# Patient Record
Sex: Male | Born: 1935 | Race: White | Hispanic: No | Marital: Single | Smoking: Former smoker
Health system: Southern US, Community
[De-identification: ages and names within clinical notes are randomized; demographics above are authoritative.]

## PROBLEM LIST (undated history)

## (undated) DIAGNOSIS — E785 Hyperlipidemia, unspecified: Secondary | ICD-10-CM

## (undated) DIAGNOSIS — D126 Benign neoplasm of colon, unspecified: Secondary | ICD-10-CM

## (undated) DIAGNOSIS — R0609 Other forms of dyspnea: Secondary | ICD-10-CM

## (undated) DIAGNOSIS — K2270 Barrett's esophagus without dysplasia: Secondary | ICD-10-CM

## (undated) DIAGNOSIS — H409 Unspecified glaucoma: Secondary | ICD-10-CM

## (undated) DIAGNOSIS — K5730 Diverticulosis of large intestine without perforation or abscess without bleeding: Secondary | ICD-10-CM

## (undated) DIAGNOSIS — E559 Vitamin D deficiency, unspecified: Secondary | ICD-10-CM

## (undated) DIAGNOSIS — E119 Type 2 diabetes mellitus without complications: Secondary | ICD-10-CM

## (undated) DIAGNOSIS — H269 Unspecified cataract: Secondary | ICD-10-CM

## (undated) DIAGNOSIS — I6529 Occlusion and stenosis of unspecified carotid artery: Secondary | ICD-10-CM

## (undated) DIAGNOSIS — C4440 Unspecified malignant neoplasm of skin of scalp and neck: Secondary | ICD-10-CM

## (undated) DIAGNOSIS — I10 Essential (primary) hypertension: Secondary | ICD-10-CM

## (undated) DIAGNOSIS — R0602 Shortness of breath: Secondary | ICD-10-CM

## (undated) DIAGNOSIS — N400 Enlarged prostate without lower urinary tract symptoms: Secondary | ICD-10-CM

## (undated) DIAGNOSIS — K2100 Gastro-esophageal reflux disease with esophagitis, without bleeding: Secondary | ICD-10-CM

## (undated) DIAGNOSIS — R0989 Other specified symptoms and signs involving the circulatory and respiratory systems: Secondary | ICD-10-CM

## (undated) DIAGNOSIS — I4510 Unspecified right bundle-branch block: Secondary | ICD-10-CM

## (undated) DIAGNOSIS — E669 Obesity, unspecified: Secondary | ICD-10-CM

## (undated) DIAGNOSIS — Z8673 Personal history of transient ischemic attack (TIA), and cerebral infarction without residual deficits: Secondary | ICD-10-CM

## (undated) DIAGNOSIS — G4733 Obstructive sleep apnea (adult) (pediatric): Secondary | ICD-10-CM

## (undated) HISTORY — PX: CATARACT EXTRACTION: SUR2

## (undated) HISTORY — DX: Barrett's esophagus without dysplasia: K22.70

## (undated) HISTORY — DX: Obstructive sleep apnea (adult) (pediatric): G47.33

## (undated) HISTORY — DX: Other forms of dyspnea: R09.89

## (undated) HISTORY — DX: Benign prostatic hyperplasia without lower urinary tract symptoms: N40.0

## (undated) HISTORY — DX: Benign neoplasm of colon, unspecified: D12.6

## (undated) HISTORY — DX: Shortness of breath: R06.02

## (undated) HISTORY — DX: Unspecified cataract: H26.9

## (undated) HISTORY — DX: Essential (primary) hypertension: I10

## (undated) HISTORY — DX: Unspecified right bundle-branch block: I45.10

## (undated) HISTORY — DX: Unspecified malignant neoplasm of skin of scalp and neck: C44.40

## (undated) HISTORY — DX: Diverticulosis of large intestine without perforation or abscess without bleeding: K57.30

## (undated) HISTORY — DX: Hyperlipidemia, unspecified: E78.5

## (undated) HISTORY — DX: Other forms of dyspnea: R06.09

## (undated) HISTORY — DX: Gastro-esophageal reflux disease with esophagitis, without bleeding: K21.00

## (undated) HISTORY — DX: Unspecified glaucoma: H40.9

## (undated) HISTORY — DX: Obesity, unspecified: E66.9

## (undated) HISTORY — DX: Gastro-esophageal reflux disease with esophagitis: K21.0

## (undated) HISTORY — DX: Vitamin D deficiency, unspecified: E55.9

## (undated) HISTORY — DX: Type 2 diabetes mellitus without complications: E11.9

## (undated) HISTORY — PX: RECTAL SURGERY: SHX760

## (undated) HISTORY — DX: Occlusion and stenosis of unspecified carotid artery: I65.29

## (undated) HISTORY — DX: Personal history of transient ischemic attack (TIA), and cerebral infarction without residual deficits: Z86.73

---
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
## 1970-09-13 HISTORY — PX: VASECTOMY: SHX75

## 1977-09-13 DIAGNOSIS — H269 Unspecified cataract: Secondary | ICD-10-CM

## 1977-09-13 HISTORY — DX: Unspecified cataract: H26.9

## 1977-09-13 HISTORY — PX: CATARACT EXTRACTION: SUR2

## 1982-09-13 DIAGNOSIS — H269 Unspecified cataract: Secondary | ICD-10-CM

## 1982-09-13 HISTORY — DX: Unspecified cataract: H26.9

## 1986-09-13 HISTORY — PX: NECK LESION BIOPSY: SHX2078

## 1993-08-13 HISTORY — PX: HERNIA REPAIR: SHX51

## 1998-11-12 HISTORY — PX: FLEXIBLE SIGMOIDOSCOPY: SHX1649

## 1999-01-14 ENCOUNTER — Ambulatory Visit (HOSPITAL_COMMUNITY): Admission: RE | Admit: 1999-01-14 | Discharge: 1999-01-14 | Payer: Self-pay | Admitting: *Deleted

## 1999-07-08 ENCOUNTER — Ambulatory Visit (HOSPITAL_COMMUNITY): Admission: RE | Admit: 1999-07-08 | Discharge: 1999-07-08 | Payer: Self-pay | Admitting: *Deleted

## 1999-07-08 ENCOUNTER — Encounter (INDEPENDENT_AMBULATORY_CARE_PROVIDER_SITE_OTHER): Payer: Self-pay

## 2000-12-07 ENCOUNTER — Encounter (INDEPENDENT_AMBULATORY_CARE_PROVIDER_SITE_OTHER): Payer: Self-pay | Admitting: Specialist

## 2001-07-02 ENCOUNTER — Emergency Department (HOSPITAL_COMMUNITY): Admission: EM | Admit: 2001-07-02 | Discharge: 2001-07-02 | Payer: Self-pay | Admitting: Emergency Medicine

## 2002-04-05 ENCOUNTER — Encounter (INDEPENDENT_AMBULATORY_CARE_PROVIDER_SITE_OTHER): Payer: Self-pay | Admitting: Specialist

## 2002-04-05 ENCOUNTER — Ambulatory Visit (HOSPITAL_COMMUNITY): Admission: RE | Admit: 2002-04-05 | Discharge: 2002-04-05 | Payer: Self-pay | Admitting: *Deleted

## 2006-09-15 LAB — HM COLONOSCOPY

## 2007-04-19 ENCOUNTER — Ambulatory Visit (HOSPITAL_COMMUNITY): Admission: RE | Admit: 2007-04-19 | Discharge: 2007-04-19 | Payer: Self-pay | Admitting: *Deleted

## 2007-04-19 ENCOUNTER — Encounter (INDEPENDENT_AMBULATORY_CARE_PROVIDER_SITE_OTHER): Payer: Self-pay | Admitting: *Deleted

## 2007-04-19 HISTORY — PX: ESOPHAGOGASTRODUODENOSCOPY: SHX1529

## 2007-04-19 HISTORY — PX: COLONOSCOPY: SHX174

## 2008-03-14 ENCOUNTER — Emergency Department (HOSPITAL_COMMUNITY): Admission: EM | Admit: 2008-03-14 | Discharge: 2008-03-14 | Payer: Self-pay | Admitting: Emergency Medicine

## 2008-04-15 ENCOUNTER — Encounter (INDEPENDENT_AMBULATORY_CARE_PROVIDER_SITE_OTHER): Payer: Self-pay | Admitting: Surgery

## 2008-04-15 ENCOUNTER — Ambulatory Visit (HOSPITAL_COMMUNITY): Admission: RE | Admit: 2008-04-15 | Discharge: 2008-04-16 | Payer: Self-pay | Admitting: Surgery

## 2009-03-07 ENCOUNTER — Encounter: Admission: RE | Admit: 2009-03-07 | Discharge: 2009-03-07 | Payer: Self-pay | Admitting: Internal Medicine

## 2009-11-08 ENCOUNTER — Emergency Department (HOSPITAL_BASED_OUTPATIENT_CLINIC_OR_DEPARTMENT_OTHER): Admission: EM | Admit: 2009-11-08 | Discharge: 2009-11-09 | Payer: Self-pay | Admitting: Emergency Medicine

## 2009-11-09 ENCOUNTER — Ambulatory Visit: Payer: Self-pay | Admitting: Interventional Radiology

## 2010-02-25 ENCOUNTER — Ambulatory Visit (HOSPITAL_COMMUNITY): Admission: RE | Admit: 2010-02-25 | Discharge: 2010-02-25 | Payer: Self-pay | Admitting: Internal Medicine

## 2010-02-25 HISTORY — PX: SPIROMETRY: SHX456

## 2010-11-29 LAB — BLOOD GAS, ARTERIAL
Acid-Base Excess: 0.2 mmol/L (ref 0.0–2.0)
Drawn by: 242311
O2 Saturation: 94.9 %
TCO2: 25.5 mmol/L (ref 0–100)
pH, Arterial: 7.409 (ref 7.350–7.450)

## 2010-12-03 LAB — POCT CARDIAC MARKERS: Myoglobin, poc: 43.8 ng/mL (ref 12–200)

## 2010-12-03 LAB — CBC
HCT: 43.5 % (ref 39.0–52.0)
Hemoglobin: 14.3 g/dL (ref 13.0–17.0)
MCHC: 32.9 g/dL (ref 30.0–36.0)
MCV: 96.5 fL (ref 78.0–100.0)
Platelets: 210 K/uL (ref 150–400)
RBC: 4.51 MIL/uL (ref 4.22–5.81)
RDW: 12.5 % (ref 11.5–15.5)
WBC: 4.7 10*3/uL (ref 4.0–10.5)

## 2010-12-03 LAB — DIFFERENTIAL
Basophils Absolute: 0.1 10*3/uL (ref 0.0–0.1)
Basophils Relative: 1 % (ref 0–1)
Eosinophils Absolute: 0.1 10*3/uL (ref 0.0–0.7)
Eosinophils Relative: 3 % (ref 0–5)
Lymphocytes Relative: 22 % (ref 12–46)
Lymphs Abs: 1 10*3/uL (ref 0.7–4.0)
Monocytes Absolute: 0.7 10*3/uL (ref 0.1–1.0)
Monocytes Relative: 15 % — ABNORMAL HIGH (ref 3–12)
Neutro Abs: 2.8 K/uL (ref 1.7–7.7)
Neutrophils Relative %: 59 % (ref 43–77)

## 2010-12-03 LAB — BASIC METABOLIC PANEL WITH GFR
BUN: 17 mg/dL (ref 6–23)
CO2: 29 meq/L (ref 19–32)
Chloride: 105 meq/L (ref 96–112)
Glucose, Bld: 164 mg/dL — ABNORMAL HIGH (ref 70–99)
Potassium: 4.7 meq/L (ref 3.5–5.1)
Sodium: 143 meq/L (ref 135–145)

## 2010-12-03 LAB — BASIC METABOLIC PANEL
Calcium: 8.6 mg/dL (ref 8.4–10.5)
Creatinine, Ser: 0.9 mg/dL (ref 0.4–1.5)
GFR calc Af Amer: >60 mL/min (ref >60)
GFR calc non Af Amer: >60 mL/min (ref >60)

## 2010-12-03 LAB — PROTIME-INR
INR: 1.03 (ref 0.00–1.49)
Prothrombin Time: 13.4 seconds (ref 11.6–15.2)

## 2010-12-03 LAB — APTT: aPTT: 23 seconds — ABNORMAL LOW (ref 24–37)

## 2011-01-26 NOTE — Op Note (Signed)
NAME:  Brett Hartman, CONGROVE NO.:  [id number]   MEDICAL RECORD NO.:  [id number]          PATIENT TYPE:  AMB   LOCATION:  ENDO                         FACILITY:  [facility]   PHYSICIAN:  Georgiana Spinner, M.D.    DATE OF BIRTH:  August 19, 1936   DATE OF PROCEDURE:  04/19/2007  DATE OF DISCHARGE:                               OPERATIVE REPORT   PROCEDURE:  Upper endoscopy.   INDICATIONS:  Gastroesophageal reflux disease.   ANESTHESIA:  Fentanyl 50 mcg, Versed 5 mg.   PROCEDURE:  With the patient mildly sedated in the left lateral  decubitus position, the Pentax videoscopic endoscope was inserted in  mouth and passed under direct vision through the esophagus which  appeared normal until we reached distal esophagus and there appeared to  be two small islands of Barrett's esophagus, possibly a third that were  photographed and biopsies were taken.  From this point the endoscope was  then advanced into the stomach.  Fundus, body, antrum, duodenal bulb,  second portion duodenum all appeared normal.  From this point the  endoscope was slowly withdrawn taking circumferential views of duodenal  mucosa until the endoscope had been pulled back into the stomach placed  in retroflexion to view the stomach from below.  The endoscope was  straightened and withdrawn taking circumferential views remaining  gastric and esophageal mucosa.  The patient's vital signs, pulse  oximeter remained stable.  The patient tolerated procedure well without  apparent complications.   FINDINGS:  Changes of Barrett's esophagus seen.  Await biopsy report.  The patient will call me for results and follow-up with me as an  outpatient.  Proceed to colonoscopy as planned           ______________________________  Georgiana Spinner, M.D.     GMO/MEDQ  D:  04/19/2007  T:  04/19/2007  Job:  [id number]   cc:   Lenon Curt. Chilton Si, M.D.  Fax: [phone]

## 2011-01-26 NOTE — Op Note (Signed)
NAME:  Brett Hartman, Brett Hartman NO.:  [id number]   MEDICAL RECORD NO.:  [id number]          PATIENT TYPE:  OIB   LOCATION:  5118                         FACILITY:  [facility]   PHYSICIAN:  Currie Paris, M.D.DATE OF BIRTH:  05/14/1936   DATE OF PROCEDURE:  DATE OF DISCHARGE:                               OPERATIVE REPORT   PREOPERATIVE DIAGNOSIS:  Chronic calculus cholecystitis.   POSTOPERATIVE DIAGNOSIS:  Chronic calculus cholecystitis.   OPERATION:  Laparoscopic cholecystectomy with operative cholangiogram.   SURGEON:  Currie Paris, MD   ASSISTANT:  Lennie Muckle, MD   ANESTHESIA:  General.   CLINICAL HISTORY:  This is a 75 year old gentleman who was seen in the  emergency department earlier this last month with what appeared to be  symptomatic gallstones.  He was scheduled for elective cholecystectomy.   DESCRIPTION OF PROCEDURE:  The patient was seen in the holding area and  had no further questions.  He confirmed that cholecystectomy was the  planned procedure.   The patient was taken to the operating room, and after satisfactory  general endotracheal anesthesia had been obtained, the abdomen was  prepped and draped as a sterile field.  The time-out was done.   I used 0.25% plain Marcaine for each incision.  An umbilical incision  was made, the fascia opened, and the peritoneal cavity entered under  direct vision.  The pursestring was placed, the Hasson introduced, and  the abdomen insufflated to 15.   The patient was placed in reverse Trendelenburg and tilted to the left.  Under direct vision, a 10/11 trocar was placed in the epigastrium and  two 5's laterally.  The liver was fairly high up and we had to make  these incisions fairly high to get good access to the gallbladder.   Using the 30-degree scope for better visualization, we were able to  retract the gallbladder and open the peritoneum in the area of the  cystic duct.  I dissected out a  nice segment of cystic artery and cystic  duct and put one clip on each.  The cystic duct was opened and operative  angiography done.   The cholangiogram showed good filling of the common ducts and hepatic  radicals with no evidence of filling defects.  There was some  significant stenosis narrowing of the distal duct, however.   The catheter was removed and three clips placed on the stay side of the  cystic duct and it was divided.  Three additional clips were placed on  the cystic artery and it was divided leaving three clips on the stay  side.  The gallbladder was removed from below to above with coagulation  current of the cautery, placed it in a bag, and subsequently brought it  out the umbilicus port site.   I irrigated to make sure everything was dry.  There was a little bit of  a raw surface along bed of the liver, so I left some Surgicel in,  although at the time of placement of the Surgicel, it was completely  dry.   After removing  the gallbladder, we irrigated to make sure everything was  dry and then suctioned out the remaining irrigant.  The lateral ports  were removed under direct vision.  A pursestring was used to close the  umbilical port and the abdomen then deflated through the epigastric  port, which was removed.  The skin was closed with 4-0 Monocryl  subcuticular plus Dermabond.   The patient tolerated the procedure well and there were no operative  complications.  All counts were correct.       Currie Paris, M.D.  Electronically Signed     CJS/MEDQ  D:  04/15/2008  T:  04/15/2008  Job:  [id number]   cc:   Lenon Curt Chilton Si, M.D.

## 2011-01-26 NOTE — Op Note (Signed)
NAME:  Brett Hartman, Brett Hartman NO.:  [id number]   MEDICAL RECORD NO.:  [id number]          PATIENT TYPE:  AMB   LOCATION:  ENDO                         FACILITY:  [facility]   PHYSICIAN:  Georgiana Spinner, M.D.    DATE OF BIRTH:  Aug 31, 1936   DATE OF PROCEDURE:  04/19/2007  DATE OF DISCHARGE:                               OPERATIVE REPORT   PROCEDURE:  Colonoscopy.   INDICATIONS:  Colon polyps.   ANESTHESIA:  Fentanyl 20 mcg and Versed __________   PROCEDURE:  With the patient mildly sedated in the left lateral  decubitus position, a rectal examination was performed which was  unremarkable to my exam.  Subsequently the Pentax videoscopic  colonoscope was inserted into the rectum and passed under direct vision  to the cecum identified by ileocecal valve and appendiceal orifice, both  of which were photographed.  From this point the colonoscope was slowly  withdrawn taking circumferential views of colonic mucosa stopping in the  descending colon where first a very small polyp was seen, photographed  and removed using hot biopsy forceps technique setting 20/150 blended  current.  We then next stopped just distal to this where a polyp on a  fold was noted.  It was photographed and it was removed.  First we  attempted to use a snare but could not ensnare this.  It was too soft  and kept sliding underneath the snare so we then elected to use the hot  biopsy forceps to take hot biopsy of it and used the hot tip to  eradicate the remainder of the polypoid tissue.  Once it had been  eradicated to my satisfaction, the colonoscope was then withdrawn taking  circumferential views of remaining colonic mucosa, stopping to  photograph diverticula seen along the way until we reached the rectum  which appeared normal on direct and showed hemorrhoids on retroflexed  view.  The endoscope was straightened and withdrawn.  The patient's  vital signs, pulse oximeter remained stable.  The patient  tolerated  procedure well without apparent complications.   FINDINGS:  Tiny polyp and also a polyp of approximately 1 cm in size but  linear in the descending colon just distal to the splenic flexure,  internal hemorrhoids and moderate diverticulosis of sigmoid colon.   PLAN:  Await biopsy report.  The patient will call me for results and  follow-up with me as an outpatient.  Of note, the patient transiently  desaturated but only mildly and consideration for evaluation for asleep  apnea should be considered.           ______________________________  Georgiana Spinner, M.D.     GMO/MEDQ  D:  04/19/2007  T:  04/19/2007  Job:  [id number]   cc:   Lenon Curt. Chilton Si, M.D.  Fax: [phone]

## 2011-01-29 NOTE — Procedures (Signed)
[facility]  Patient:    Brett Hartman, Brett Hartman                        MRN: [id number] Proc. Date: 12/07/00 Adm. Date:  [id number] Attending:  Sabino Gasser                           Procedure Report  PROCEDURE:  Upper endoscopy with biopsy.  INDICATION FOR PROCEDURE:  Barretts esophagus.  ANESTHESIA:  Demerol 70, Versed 7 mg.  DESCRIPTION OF PROCEDURE:  With the patient mildly sedated in the left lateral decubitus position, the Olympus video endoscope was inserted in the mouth and passed under direct vision through the esophagus which appeared normal until we reached the distal esophagus which showed changes of Barretts esophagus. These were photographed and biopsies were taken. The endoscope was advanced into the stomach. The fundus, body and antrum appeared normal except for some minor erythema of the prepyloric area which was photographed and biopsied. We entered into the duodenal bulb, there were some food particles there. The duodenal bulb otherwise appeared normal as did the second portion of the duodenum. Photographs were taken. From this point, the endoscope was slowly withdrawn taking circumferential views of the entire duodenal mucosa until the endoscope was then pulled back into the stomach, placed in retroflexion to view the stomach from below. The endoscope was then straightened and withdrawn taking circumferential views of the remaining gastric and esophageal mucosa which otherwise appeared normal. The patients vital signs and pulse oximeter remained stable. The patient tolerated the procedure well without apparent complications.  FINDINGS:  Changes of Barretts esophagus as previously biopsied. Await biopsy report. The patient will call me for results and followup with me in as an outpatient. DD:  12/07/00 TD:  12/07/00 Job: [id number]

## 2011-01-29 NOTE — Procedures (Signed)
[facility]  Patient:    Brett Hartman, Brett Hartman Visit Number: [id number] MRN: [id number]          Service Type: END Location: ENDO Attending Physician:  Sabino Gasser Dictated by:   Sabino Gasser, M.D. Proc. Date: 04/05/02 Admit Date:  04/05/2002 Discharge Date: 04/05/2002                             Procedure Report  PROCEDURE:  Upper endoscopy.  INDICATION FOR PROCEDURE:  Gastroesophageal reflux disease, Barretts esophagus.  ANESTHESIA:  Demerol 50, Versed 5 mg.  DESCRIPTION OF PROCEDURE:  With the patient mildly sedated in the left lateral decubitus position, the Olympus videoscopic endoscope was inserted in the mouth and passed under direct vision through the esophagus where there was a question of Barretts esophagus seen and photographed and subsequently biopsied. We entered into the stomach. The fundus, body, antrum, duodenal bulb and second portion of the duodenum were viewed. From this point, the endoscope was slowly withdrawn taking circumferential views of the duodenal mucosa until the endoscope was then pulled back into the stomach, placed in retroflexion to view the stomach from below. The endoscope was then straightened withdrawn taking circumferential views of the remaining gastric and esophageal mucosa stopping in the body of the stomach where changes of gastritis were seen, photographed and biopsied. The endoscope was withdrawn. The patients vital signs and pulse oximeter remained stable. The patient tolerated the procedure well without apparent complications.  FINDINGS:  Changes of Barretts esophagus biopsied. Changes of gastritis in the body of the stomach biopsied. Await biopsy report. The patient will call me for results and follow-up with me as an outpatient. Dictated by:   Sabino Gasser, M.D. Attending Physician:  Sabino Gasser DD:  04/05/02 TD:  04/09/02 Job: [id number]

## 2011-01-29 NOTE — Consult Note (Signed)
[place]  Patient:    Brett Hartman, Brett Hartman Visit Number: [id number] MRN: [id number]          Service Type: EMS Location: ED Attending Physician:  Ilene Qua Dictated by:   Gloris Manchester. Lazarus Salines, M.D. Proc. Date: 07/02/01 Admit Date:  07/02/2001 Discharge Date: 07/02/2001   CC:         Dr. Vinson Moselle   Consultation Report  CHIEF COMPLAINT:  Epistaxis.  HISTORY OF PRESENT ILLNESS:  The patient is a 75 year old white male who last evening bent over to pick up something and had relatively quick onset of profuse right-sided nosebleed. He managed to get it stopped with what he claims is just a papertowel up his nostril. This morning, it started again, and he could not get it stopped. He presents to the emergency room. He has had off-and-on hypertension but nothing sustained or requiring medication. He takes one aspirin daily but no other known bleeding tendencies. No recent trauma to the nose. No past history of surgery to the nose. No recent upper respiratory infection or sinus infection. He did not blow his nose, strain at stool, or lift anything heavy at the onset. He is not bleeding anywhere else nor has he any past history of bleeding tendencies. He was evaluated in the [facility] Emergency Room by Dr. Vinson Moselle who attempted to place a Merocel pack without successful control, and ENT was consulted.  ALLERGIES:  ______ which is a deworming agent in the past. Also allergic allegedly to CORTISONE.  SOCIAL HISTORY:  Does not smoke.  PHYSICAL EXAMINATION:  GENERAL:  This is an affable, somewhat overweight, middle-aged white male in no distress. He is actively bleeding from the right side of his nose and also a small amount of blood in the pharynx. Mental status is acute. He hears well in conversational speech. Voice is clear and respirations unlabored.  HEENT:  The head is atraumatic. Both ear canals are clear with aerated drums and normal  configuration. Anterior nose shows a significantly corrugated septum with spurring along the maxillary crest on the right side with a high septal deflection over toward to the right and a moderate amount of blood in the middle meatus and superior meatus/vault of the nose on the right side. Left side is similarly corrugated with no blood. No ______ . Very slight oozing from the septum consistent with prior manipulation. Oral cavity reveals teeth in fair to good repair with no ______ . Small amount of blood in the oropharynx. Did not examine nasopharynx or hypopharynx.  NECK:  Supple. Without adenopathy.  NEUROLOGICAL:  Cranial nerves intact.  IMPRESSION:  High right epistaxis.  PROCEDURE:  With informed consent, anesthetized the right nose with 4% cocaine solution. Following this, the 30-degree nasal endoscope was introduced, and no active bleeding sites could be identified. A small amount of blood was suctioned free from the middle meatus and from the vault of the nose/superior meatus. It appeared to be oozing in between the middle turbinate and the septum high in the mid nose. No other active oozing sites were noted. A Merocel pack with a string was placed up into the vault of the nose back into the superior meatus region. This failed to completely control the oozing but did cause some excoriations which were oozing, possibly consistent with the aspirin. An additional Merocel pack was placed between the middle turbinate and the septum. The patient had persistent oozing from the high anterior vault of the nose and  finally a third piece of Merocel was placed up into the high anterior vault of the nose. All of these were moistened with saline for expansion and to make him more comfortable. Each had a string tag. At this point, no further bleeding was identified. The patient was observed for approximately 15 minutes to make sure there would be no further bleeding and then discharged to  his home in the care of his family.  PLAN:  I will have him keep the head elevated and use Keflex to prevent infection and to use Tylenol No. 3 for discomfort. Hold off on his aspirin for one week and return for packing removal on Wednesday or Thursday. Dictated by:   Gloris Manchester. Lazarus Salines, M.D. Attending Physician:  Ilene Qua DD:  07/02/01 TD:  07/03/01 Job: [id number]

## 2011-01-29 NOTE — Procedures (Signed)
[facility]  Patient:    Brett Hartman, Brett Hartman Visit Number: [id number] MRN: [id number]          Service Type: END Location: ENDO Attending Physician:  Sabino Gasser Dictated by:   Sabino Gasser, M.D. Proc. Date: 04/05/02 Admit Date:  04/05/2002 Discharge Date: 04/05/2002                             Procedure Report  PROCEDURE:  Colonoscopy.  INDICATION FOR PROCEDURE:  Colon polyp.  ANESTHESIA:  Demerol 25, Versed 2 mg additionally.  DESCRIPTION OF PROCEDURE:  With the patient mildly sedated in the left lateral decubitus position, the Olympus videoscopic colonoscope was inserted in the rectum and passed under direct vision to the cecum identified by the ileocecal valve and appendiceal orifice both of which were photographed. From this point, the colonoscope was slowly withdrawn taking circumferential views of the entire colonic mucosa, stopping only in the rectum which appeared normal in direct and retroflexed view. The endoscope was straightened and withdrawn. The patients vital signs and pulse oximeter remained stable. The patient tolerated the procedure well without apparent complications.  FINDINGS:  Negative colonoscopic examination. A patient with a history of polyps in the past. Will repeat examination in approximately five years. Dictated by:   Sabino Gasser, M.D. Attending Physician:  Sabino Gasser DD:  04/05/02 TD:  04/09/02 Job: [id number]

## 2011-06-10 LAB — CBC
MCHC: 34
MCV: 94.2
RBC: 4.91
RDW: 13.1

## 2011-06-10 LAB — COMPREHENSIVE METABOLIC PANEL
ALT: 266 — ABNORMAL HIGH
AST: 254 — ABNORMAL HIGH
Albumin: 3.8
BUN: 15
Chloride: 100
Creatinine, Ser: 1.1
GFR calc Af Amer: >60
Total Bilirubin: 2.9 — ABNORMAL HIGH
Total Protein: 7.5

## 2011-06-10 LAB — DIFFERENTIAL
Basophils Relative: 0
Eosinophils Absolute: 0
Eosinophils Relative: 0
Lymphs Abs: 0.5 — ABNORMAL LOW
Monocytes Relative: 7

## 2011-06-10 LAB — LIPASE, BLOOD: Lipase: 19

## 2011-06-10 LAB — POCT CARDIAC MARKERS: Myoglobin, poc: 58.6

## 2011-06-11 LAB — URINALYSIS, ROUTINE W REFLEX MICROSCOPIC
Bilirubin Urine: NEGATIVE
Ketones, ur: NEGATIVE
Specific Gravity, Urine: 1.018
Urobilinogen, UA: 1

## 2011-06-11 LAB — DIFFERENTIAL
Basophils Absolute: 0
Basophils Relative: 0
Eosinophils Relative: 3
Lymphocytes Relative: 17

## 2011-06-11 LAB — COMPREHENSIVE METABOLIC PANEL
AST: 19
BUN: 15
Calcium: 9.1
Creatinine, Ser: 1.01
Total Protein: 6.6

## 2011-06-11 LAB — CBC
HCT: 43
MCV: 96.2
Platelets: 215
RDW: 13.1

## 2012-12-19 ENCOUNTER — Other Ambulatory Visit: Payer: Self-pay | Admitting: *Deleted

## 2012-12-19 MED ORDER — LISINOPRIL 40 MG PO TABS: ORAL_TABLET | ORAL | Status: DC

## 2013-02-14 ENCOUNTER — Other Ambulatory Visit: Payer: Self-pay | Admitting: *Deleted

## 2013-02-14 MED ORDER — SIMVASTATIN 20 MG PO TABS: ORAL_TABLET | ORAL | Status: DC

## 2013-05-03 ENCOUNTER — Other Ambulatory Visit: Payer: Self-pay

## 2013-05-07 ENCOUNTER — Encounter: Payer: Self-pay | Admitting: *Deleted

## 2013-05-07 ENCOUNTER — Other Ambulatory Visit: Payer: Self-pay

## 2013-05-08 ENCOUNTER — Ambulatory Visit (INDEPENDENT_AMBULATORY_CARE_PROVIDER_SITE_OTHER): Payer: Medicare Other | Admitting: Internal Medicine

## 2013-05-08 ENCOUNTER — Encounter: Payer: Self-pay | Admitting: *Deleted

## 2013-05-08 ENCOUNTER — Encounter: Payer: Self-pay | Admitting: Internal Medicine

## 2013-05-08 VITALS — BP 124/70 | HR 84 | Temp 97.9°F | Resp 18 | Wt 246.5 lb

## 2013-05-08 DIAGNOSIS — Z23 Encounter for immunization: Secondary | ICD-10-CM

## 2013-05-08 DIAGNOSIS — I10 Essential (primary) hypertension: Secondary | ICD-10-CM | POA: Insufficient documentation

## 2013-05-08 DIAGNOSIS — H409 Unspecified glaucoma: Secondary | ICD-10-CM | POA: Insufficient documentation

## 2013-05-08 DIAGNOSIS — R7309 Other abnormal glucose: Secondary | ICD-10-CM

## 2013-05-08 DIAGNOSIS — N400 Enlarged prostate without lower urinary tract symptoms: Secondary | ICD-10-CM

## 2013-05-08 DIAGNOSIS — K2270 Barrett's esophagus without dysplasia: Secondary | ICD-10-CM

## 2013-05-08 DIAGNOSIS — R739 Hyperglycemia, unspecified: Secondary | ICD-10-CM

## 2013-05-08 DIAGNOSIS — I4510 Unspecified right bundle-branch block: Secondary | ICD-10-CM | POA: Insufficient documentation

## 2013-05-08 DIAGNOSIS — K5730 Diverticulosis of large intestine without perforation or abscess without bleeding: Secondary | ICD-10-CM

## 2013-05-08 DIAGNOSIS — G4733 Obstructive sleep apnea (adult) (pediatric): Secondary | ICD-10-CM

## 2013-05-08 DIAGNOSIS — E559 Vitamin D deficiency, unspecified: Secondary | ICD-10-CM | POA: Insufficient documentation

## 2013-05-08 DIAGNOSIS — E119 Type 2 diabetes mellitus without complications: Secondary | ICD-10-CM

## 2013-05-08 DIAGNOSIS — E785 Hyperlipidemia, unspecified: Secondary | ICD-10-CM

## 2013-05-08 MED ORDER — CLOTRIMAZOLE 1 % EX CREA: 1.0000 "application " | TOPICAL_CREAM | CUTANEOUS | Status: DC | PRN

## 2013-05-08 MED ORDER — INFLUENZA VAC SPLIT QUAD 0.5 ML IM SUSP
0.5000 mL | Freq: Once | INTRAMUSCULAR | Status: AC
  Administered 2013-05-08: 0.5 mL via INTRAMUSCULAR

## 2013-05-08 NOTE — Progress Notes (Signed)
Subjective:    Patient ID: Brett Hartman, male    DOB: July 15, 1936, 77 y.o.   MRN: [id number]  HPI  Unspecified essential hypertension  Type II or unspecified type diabetes mellitus without mention of complication, not stated as uncontrolled  Other and unspecified hyperlipidemia  Barrett's esophagus  Obstructive sleep apnea (adult) (pediatric): Using CPAP  Right bundle branch block  Hypertrophy of prostate without urinary obstruction and other lower urinary tract symptoms (LUTS): still with leakage and dribbling. Stream is good.   Unspecified vitamin D deficiency  Diverticulosis of colon (without mention of hemorrhage)  Unspecified glaucoma(365.9)  Has lost 9# since last seen.    Current Outpatient Prescriptions on File Prior to Visit  Medication Sig Dispense Refill  . augmented betamethasone dipropionate (DIPROLENE AF) 0.05 % cream Apply topically daily. Apply sparingly to affected area once daily.      . clopidogrel (PLAVIX) 75 MG tablet Take 75 mg by mouth daily. Take 1 tablet  daily to prevent stroke.      . dorzolamide-timolol (COSOPT) 22.3-6.8 MG/ML ophthalmic solution Place 1 drop into both eyes 2 (two) times daily.      . finasteride (PROSCAR) 5 MG tablet Take 5 mg by mouth daily. Take 1 tablet once daily for urinary dribbling/prostate enlargement.      Marland Kitchen lisinopril (PRINIVIL,ZESTRIL) 20 MG tablet Take 20 mg by mouth daily. Take 1 tablet once daily to control blood pressure.      . pantoprazole (PROTONIX) 40 MG tablet Take 40 mg by mouth daily. Take 1 tablet once daily for reflux.      . simvastatin (ZOCOR) 20 MG tablet Take 20 mg by mouth daily. Take 1 tablet by mouth once daily for cholesterol      . tamsulosin (FLOMAX) 0.4 MG CAPS capsule Take 0.4 mg by mouth at bedtime. Take 1 capsule by mouth at bedtime for prostate.      . vardenafil (LEVITRA) 20 MG tablet Take 20 mg by mouth daily as needed for erectile dysfunction. Take 1 tablet  prior to intercourse.       No  current facility-administered medications on file prior to visit.    Review of Systems  Constitutional: Negative for fever, chills, activity change, appetite change, fatigue and unexpected weight change.  HENT: Positive for hearing loss and neck stiffness. Negative for ear pain and neck pain.   Eyes: Positive for visual disturbance.       Blind in the left eye.  Respiratory: Positive for shortness of breath.        History of dyspnea on exertion.  Cardiovascular: Negative for chest pain, palpitations and leg swelling.  Gastrointestinal:       Heartburn and reflux.  Endocrine:       History of diabetes. History of hot flashes.  Genitourinary:       Poor quality erections. Some social difficulties. Urinary frequency and hesitation was improved with tamsulosin.  Musculoskeletal: Negative for myalgias, back pain, joint swelling, arthralgias and gait problem.  Skin: Negative.   Allergic/Immunologic: Negative.   Neurological: Negative.   Hematological: Negative.   Psychiatric/Behavioral: Negative.        Objective:BP 124/70  Pulse 84  Temp(Src) 97.9 F (36.6 C) (Oral)  Resp 18  Wt 246 lb 8 oz (111.812 kg)  SpO2 95%    Physical Exam  Constitutional: He is oriented to person, place, and time. He appears well-developed and well-nourished. No distress.  Moderately obese body habitus.  HENT:  Head: Normocephalic and atraumatic.  Right Ear: External ear normal.  Left Ear: External ear normal.  Nose: Nose normal.  Mouth/Throat: Oropharynx is clear and moist.  Eyes: Conjunctivae and EOM are normal. Pupils are equal, round, and reactive to light.  Neck:  1/4 right carotid bruit  Cardiovascular: Normal rate, regular rhythm, normal heart sounds and intact distal pulses.  Exam reveals no gallop and no friction rub.   No murmur heard. Pulmonary/Chest: No respiratory distress. He has no wheezes. He has no rales. He exhibits no tenderness.  Abdominal: Bowel sounds are normal. He exhibits  no distension and no mass. There is no tenderness. There is no rebound and no guarding.  Musculoskeletal: Normal range of motion. He exhibits edema. He exhibits no tenderness.  Neurological: He is alert and oriented to person, place, and time. He has normal reflexes. No cranial nerve deficit. Coordination normal.  Skin: No rash noted. No erythema. No pallor.  Psychiatric: He has a normal mood and affect. His behavior is normal. Judgment and thought content normal.     Office Visit on 05/08/2013  Component Date Value Range Status  . Hemoglobin A1C 05/08/2013 6.1* 4.8 - 5.6 % Final   Comment:          Increased risk for diabetes: 5.7 - 6.4                                   Diabetes: >6.4                                   Glycemic control for adults with diabetes: <7.0  . Estimated average glucose 05/08/2013 128   Final  . Cholesterol, Total 05/08/2013 149  100 - 199 mg/dL Final  . Triglycerides 05/08/2013 110  0 - 149 mg/dL Final  . HDL 16/06/9603 32* >39 mg/dL Final   Comment: According to ATP-III Guidelines, HDL-C >59 mg/dL is considered a                          negative risk factor for CHD.  Marland Kitchen VLDL Cholesterol Cal 05/08/2013 22  5 - 40 mg/dL Final  . LDL Calculated 05/08/2013 95  0 - 99 mg/dL Final  . Chol/HDL Ratio 05/08/2013 4.7  0.0 - 5.0 ratio units Final  . Glucose 05/08/2013 99  65 - 99 mg/dL Final  . BUN 54/05/8118 12  8 - 27 mg/dL Final  . Creatinine, Ser 05/08/2013 1.05  0.76 - 1.27 mg/dL Final  . GFR calc non Af Amer 05/08/2013 69  >59 mL/min/1.73 Final  . GFR calc Af Amer 05/08/2013 80  >59 mL/min/1.73 Final  . BUN/Creatinine Ratio 05/08/2013 11  10 - 22 Final  . Sodium 05/08/2013 142  134 - 144 mmol/L Final  . Potassium 05/08/2013 5.0  3.5 - 5.2 mmol/L Final  . Chloride 05/08/2013 102  97 - 108 mmol/L Final  . CO2 05/08/2013 26  18 - 29 mmol/L Final  . Calcium 05/08/2013 9.2  8.6 - 10.2 mg/dL Final         Assessment & Plan:  Need for prophylactic vaccination  and inoculation against influenza   - Plan: influenza vac split quadrivalent PF (FLUARIX) injection 0.5 mL  Unspecified essential hypertension: Controlled   - Plan: EKG 12-Lead  Type II or unspecified type diabetes mellitus without mention of complication, not  stated as uncontrolled: Controlled   - Plan: Basic metabolic panel, Hemoglobin A1c, Comprehensive metabolic panel, Microalbumin/Creatinine Ratio, Urine  Other and unspecified hyperlipidemia": Controlled   - Plan: Lipid panel, Lipid panel  Barrett's esophagus: Continue to monitor; no dysphagia. - Last EGD was about 2006  Obstructive sleep apnea (adult) (pediatric): Continue CPAP  Right bundle branch block: Unchanged  Hypertrophy of prostate without urinary obstruction and other lower urinary tract symptoms (LUTS): Improved on tamsulosin  Unspecified vitamin D deficiency: Stable  Diverticulosis of colon (without mention of hemorrhage): Unchanged, asymptomatic  Unspecified glaucoma(365.9): Asymptomatic  Hyperglycemia: Controlled   - Plan: Basic Metabolic Panel

## 2013-05-08 NOTE — Patient Instructions (Signed)
Continue current medication.

## 2013-05-09 LAB — LIPID PANEL
Cholesterol, Total: 149 mg/dL (ref 100–199)
HDL: 32 mg/dL — ABNORMAL LOW (ref >39)
VLDL Cholesterol Cal: 22 mg/dL (ref 5–40)

## 2013-05-09 LAB — BASIC METABOLIC PANEL
BUN/Creatinine Ratio: 11 (ref 10–22)
BUN: 12 mg/dL (ref 8–27)
Chloride: 102 mmol/L (ref 97–108)
GFR calc Af Amer: 80 mL/min/{1.73_m2} (ref >59)
GFR calc non Af Amer: 69 mL/min/{1.73_m2} (ref >59)

## 2013-05-09 LAB — HEMOGLOBIN A1C
Est. average glucose Bld gHb Est-mCnc: 128 mg/dL
Hgb A1c MFr Bld: 6.1 % — ABNORMAL HIGH (ref 4.8–5.6)

## 2013-05-11 ENCOUNTER — Other Ambulatory Visit: Payer: Self-pay | Admitting: Internal Medicine

## 2013-08-20 ENCOUNTER — Other Ambulatory Visit: Payer: Self-pay | Admitting: Internal Medicine

## 2013-09-21 ENCOUNTER — Other Ambulatory Visit: Payer: Self-pay | Admitting: Internal Medicine

## 2013-09-24 ENCOUNTER — Other Ambulatory Visit: Payer: Self-pay | Admitting: Internal Medicine

## 2013-09-26 ENCOUNTER — Other Ambulatory Visit: Payer: Self-pay | Admitting: *Deleted

## 2013-09-26 ENCOUNTER — Other Ambulatory Visit: Payer: Self-pay | Admitting: Internal Medicine

## 2013-09-26 MED ORDER — LISINOPRIL 40 MG PO TABS: ORAL_TABLET | ORAL | Status: DC

## 2013-09-27 ENCOUNTER — Other Ambulatory Visit: Payer: Self-pay | Admitting: Internal Medicine

## 2013-10-17 ENCOUNTER — Other Ambulatory Visit: Payer: Medicare Other

## 2013-10-17 DIAGNOSIS — E785 Hyperlipidemia, unspecified: Secondary | ICD-10-CM

## 2013-10-17 DIAGNOSIS — E119 Type 2 diabetes mellitus without complications: Secondary | ICD-10-CM

## 2013-10-18 LAB — COMPREHENSIVE METABOLIC PANEL
A/G RATIO: 1.9 (ref 1.1–2.5)
ALBUMIN: 4.1 g/dL (ref 3.5–4.8)
ALK PHOS: 40 IU/L (ref 39–117)
ALT: 63 IU/L — ABNORMAL HIGH (ref 0–44)
AST: 32 IU/L (ref 0–40)
BUN / CREAT RATIO: 17 (ref 10–22)
BUN: 17 mg/dL (ref 8–27)
CO2: 26 mmol/L (ref 18–29)
CREATININE: 1 mg/dL (ref 0.76–1.27)
Calcium: 8.8 mg/dL (ref 8.6–10.2)
Chloride: 99 mmol/L (ref 97–108)
GFR calc Af Amer: 84 mL/min/{1.73_m2} (ref >59)
GFR, EST NON AFRICAN AMERICAN: 73 mL/min/{1.73_m2} (ref >59)
GLOBULIN, TOTAL: 2.2 g/dL (ref 1.5–4.5)
Glucose: 97 mg/dL (ref 65–99)
Potassium: 4.7 mmol/L (ref 3.5–5.2)
SODIUM: 141 mmol/L (ref 134–144)
Total Bilirubin: 0.3 mg/dL (ref 0.0–1.2)
Total Protein: 6.3 g/dL (ref 6.0–8.5)

## 2013-10-18 LAB — MICROALBUMIN / CREATININE URINE RATIO
CREATININE UR: 175.2 mg/dL (ref 22.0–328.0)
MICROALB/CREAT RATIO: 10.5 mg/g{creat} (ref 0.0–30.0)
Microalbumin, Urine: 18.4 ug/mL — ABNORMAL HIGH (ref 0.0–17.0)

## 2013-10-18 LAB — LIPID PANEL
CHOL/HDL RATIO: 3.4 ratio (ref 0.0–5.0)
Cholesterol, Total: 147 mg/dL (ref 100–199)
HDL: 43 mg/dL (ref >39)
LDL CALC: 83 mg/dL (ref 0–99)
TRIGLYCERIDES: 104 mg/dL (ref 0–149)
VLDL Cholesterol Cal: 21 mg/dL (ref 5–40)

## 2013-10-19 ENCOUNTER — Other Ambulatory Visit: Payer: Medicare Other

## 2013-10-23 ENCOUNTER — Ambulatory Visit: Payer: Medicare Other | Admitting: Internal Medicine

## 2013-11-13 ENCOUNTER — Encounter: Payer: Self-pay | Admitting: *Deleted

## 2013-11-14 ENCOUNTER — Encounter: Payer: Self-pay | Admitting: Internal Medicine

## 2013-11-14 ENCOUNTER — Ambulatory Visit (INDEPENDENT_AMBULATORY_CARE_PROVIDER_SITE_OTHER): Payer: Medicare Other | Admitting: Internal Medicine

## 2013-11-14 VITALS — BP 118/72 | HR 86 | Temp 97.7°F | Wt 238.4 lb

## 2013-11-14 DIAGNOSIS — E119 Type 2 diabetes mellitus without complications: Secondary | ICD-10-CM

## 2013-11-14 DIAGNOSIS — L439 Lichen planus, unspecified: Secondary | ICD-10-CM

## 2013-11-14 DIAGNOSIS — L438 Other lichen planus: Secondary | ICD-10-CM | POA: Insufficient documentation

## 2013-11-14 DIAGNOSIS — I10 Essential (primary) hypertension: Secondary | ICD-10-CM

## 2013-11-14 DIAGNOSIS — K2270 Barrett's esophagus without dysplasia: Secondary | ICD-10-CM

## 2013-11-14 DIAGNOSIS — E785 Hyperlipidemia, unspecified: Secondary | ICD-10-CM

## 2013-11-14 NOTE — Patient Instructions (Addendum)
Continue medications as listed 

## 2013-11-14 NOTE — Progress Notes (Signed)
Patient ID: Brett Hartman, male   DOB: 01-16-1936, 78 y.o.   MRN: [id number]    Location:    [facility]  Place of Service:  office    Allergies  Allergen Reactions  . Cortisone   . Piperacetazine Other (See Comments)    Dry Mouth    Chief Complaint  Patient presents with  . Medical Managment of Chronic Issues    6 month f/u & discuss labs (printed)    HPI:  Type II or unspecified type diabetes mellitus without mention of complication, not stated as uncontrolled - controlled  Unspecified essential hypertension -controlled  Other and unspecified hyperlipidemia - controlled  Barrett's esophagus: asymptomatic.Takes PPI regularly  Had lichen planus of tongue treated by doctor in [place].    Medications: Patient's Medications  New Prescriptions   No medications on file  Previous Medications   AUGMENTED BETAMETHASONE DIPROPIONATE (DIPROLENE AF) 0.05 % CREAM    Apply topically daily. Apply sparingly to affected area once daily.   CLOPIDOGREL (PLAVIX) 75 MG TABLET    Take 75 mg by mouth daily. Take 1 tablet  daily to prevent stroke.   CLOTRIMAZOLE (CLOTRIMAZOLE ANTI-FUNGAL) 1 % CREAM    Apply 1 application topically as needed.   DORZOLAMIDE-TIMOLOL (COSOPT) 22.3-6.8 MG/ML OPHTHALMIC SOLUTION    Place 1 drop into both eyes 2 (two) times daily.   FINASTERIDE (PROSCAR) 5 MG TABLET    Take 5 mg by mouth daily. Take 1 tablet once daily for urinary dribbling/prostate enlargement.   FLUOCINONIDE CREAM (LIDEX) 0.05 %    Use as directed in the mouth or throat. Use 3-4 times daily as needed   LISINOPRIL (PRINIVIL,ZESTRIL) 40 MG TABLET    TAKE 1/2 TABLET BY MOUTH ONCE A DAY FOR BLOOD PRESSURE   PANTOPRAZOLE (PROTONIX) 40 MG TABLET    Take 40 mg by mouth daily. Take 1 tablet once daily for reflux.   SIMVASTATIN (ZOCOR) 20 MG TABLET    TAKE 1/2 TABLET BY MOUTH AT BEDTIME FOR CHOLESTEROL   TAMSULOSIN (FLOMAX) 0.4 MG CAPS CAPSULE    Take 0.4 mg by mouth at bedtime. Take 1 capsule by mouth at  bedtime for prostate.   VARDENAFIL (LEVITRA) 20 MG TABLET    Take 20 mg by mouth daily as needed for erectile dysfunction. Take 1 tablet  prior to intercourse.  Modified Medications   No medications on file  Discontinued Medications   DOXYCYCLINE (DOXY 100) 100 MG INJECTION    by Mouth Rinse route. ! Teaspoon @@ night and swish for 3 mins and rinse.   LISINOPRIL (PRINIVIL,ZESTRIL) 20 MG TABLET    Take 20 mg by mouth daily. Take 1 tablet once daily to control blood pressure.     Review of Systems  Constitutional: Negative for fever, chills, activity change, appetite change, fatigue and unexpected weight change.  HENT: Positive for hearing loss. Negative for ear pain.   Eyes: Positive for visual disturbance.       Blind in the left eye.  Respiratory: Positive for shortness of breath.        History of dyspnea on exertion.  Cardiovascular: Negative for chest pain, palpitations and leg swelling.  Gastrointestinal:       Heartburn and reflux.  Endocrine:       History of diabetes. History of hot flashes.  Genitourinary:       Poor quality erections. Some social difficulties. Urinary frequency and hesitation was improved with tamsulosin.  Musculoskeletal: Positive for neck stiffness. Negative for arthralgias,  back pain, gait problem, joint swelling, myalgias and neck pain.  Skin: Negative.   Allergic/Immunologic: Negative.   Neurological: Negative.   Hematological: Negative.   Psychiatric/Behavioral: Negative.     Filed Vitals:   11/14/13 1227  BP: 118/72  Pulse: 86  Temp: 97.7 F (36.5 C)  TempSrc: Oral  Weight: 238 lb 6.4 oz (108.138 kg)  SpO2: 94%   Physical Exam  Constitutional: He is oriented to person, place, and time. He appears well-developed and well-nourished. No distress.  Moderately obese body habitus.  HENT:  Head: Normocephalic and atraumatic.  Right Ear: External ear normal.  Left Ear: External ear normal.  Nose: Nose normal.  Mouth/Throat: Oropharynx is  clear and moist.  Eyes: Conjunctivae and EOM are normal. Pupils are equal, round, and reactive to light.  Neck:  1/4 right carotid bruit  Cardiovascular: Normal rate, regular rhythm, normal heart sounds and intact distal pulses.  Exam reveals no gallop and no friction rub.   No murmur heard. Pulmonary/Chest: No respiratory distress. He has no wheezes. He has no rales. He exhibits no tenderness.  Abdominal: Bowel sounds are normal. He exhibits no distension and no mass. There is no tenderness. There is no rebound and no guarding.  Musculoskeletal: Normal range of motion. He exhibits edema. He exhibits no tenderness.  Neurological: He is alert and oriented to person, place, and time. He has normal reflexes. No cranial nerve deficit. Coordination normal.  Skin: No rash noted. No erythema. No pallor.  Psychiatric: He has a normal mood and affect. His behavior is normal. Judgment and thought content normal.     Labs reviewed: Abstract on 11/13/2013  Component Date Value Ref Range Status  . HM Colonoscopy 09/15/2006 Dr Lajoyce Corners, polyps, repeat 3-5 years   Final  Appointment on 10/17/2013  Component Date Value Ref Range Status  . Glucose 10/17/2013 97  65 - 99 mg/dL Final  . BUN 10/17/2013 17  8 - 27 mg/dL Final  . Creatinine, Ser 10/17/2013 1.00  0.76 - 1.27 mg/dL Final  . GFR calc non Af Amer 10/17/2013 73  >59 mL/min/1.73 Final  . GFR calc Af Amer 10/17/2013 84  >59 mL/min/1.73 Final  . BUN/Creatinine Ratio 10/17/2013 17  10 - 22 Final  . Sodium 10/17/2013 141  134 - 144 mmol/L Final  . Potassium 10/17/2013 4.7  3.5 - 5.2 mmol/L Final  . Chloride 10/17/2013 99  97 - 108 mmol/L Final  . CO2 10/17/2013 26  18 - 29 mmol/L Final  . Calcium 10/17/2013 8.8  8.6 - 10.2 mg/dL Final  . Total Protein 10/17/2013 6.3  6.0 - 8.5 g/dL Final  . Albumin 10/17/2013 4.1  3.5 - 4.8 g/dL Final  . Globulin, Total 10/17/2013 2.2  1.5 - 4.5 g/dL Final  . Albumin/Globulin Ratio 10/17/2013 1.9  1.1 - 2.5 Final  .  Total Bilirubin 10/17/2013 0.3  0.0 - 1.2 mg/dL Final  . Alkaline Phosphatase 10/17/2013 40  39 - 117 IU/L Final  . AST 10/17/2013 32  0 - 40 IU/L Final  . ALT 10/17/2013 63* 0 - 44 IU/L Final  . Cholesterol, Total 10/17/2013 147  100 - 199 mg/dL Final  . Triglycerides 10/17/2013 104  0 - 149 mg/dL Final  . HDL 10/17/2013 43  >39 mg/dL Final   Comment: According to ATP-III Guidelines, HDL-C >59 mg/dL is considered a  negative risk factor for CHD.  Marland Kitchen VLDL Cholesterol Cal 10/17/2013 21  5 - 40 mg/dL Final  . LDL Calculated 10/17/2013 83  0 - 99 mg/dL Final  . Chol/HDL Ratio 10/17/2013 3.4  0.0 - 5.0 ratio units Final   Comment:                                   T. Chol/HDL Ratio                                                                      Men  Women                                                        1/2 Avg.Risk  3.4    3.3                                                            Avg.Risk  5.0    4.4                                                         2X Avg.Risk  9.6    7.1                                                         3X Avg.Risk 23.4   11.0  . Creatinine, Ur 10/17/2013 175.2  22.0 - 328.0 mg/dL Final  . Microalbum.,U,Random 10/17/2013 18.4* 0.0 - 17.0 ug/mL Final  . MICROALB/CREAT RATIO 10/17/2013 10.5  0.0 - 30.0 mg/g creat Final      Assessment/Plan  Type II or unspecified type diabetes mellitus without mention of complication, not stated as uncontrolled : controlled  - Plan: Hemoglobin A1c, Comprehensive metabolic panel  Unspecified essential hypertension : controlled  - Plan: Comprehensive metabolic panel, EKG [id number]  Other and unspecified hyperlipidemia: controlled   - Plan: Lipid panel  Barrett's esophagus: continue PPI  Oral lichen planus: improved

## 2014-02-19 ENCOUNTER — Other Ambulatory Visit: Payer: Self-pay | Admitting: Internal Medicine

## 2014-04-01 ENCOUNTER — Emergency Department (HOSPITAL_COMMUNITY): Payer: Medicare Other

## 2014-04-01 ENCOUNTER — Inpatient Hospital Stay (HOSPITAL_COMMUNITY)
Admission: EM | Admit: 2014-04-01 | Discharge: 2014-04-13 | DRG: 023 | Disposition: E | Payer: Medicare Other | Attending: Neurology | Admitting: Neurology

## 2014-04-01 DIAGNOSIS — Z823 Family history of stroke: Secondary | ICD-10-CM | POA: Diagnosis not present

## 2014-04-01 DIAGNOSIS — N400 Enlarged prostate without lower urinary tract symptoms: Secondary | ICD-10-CM | POA: Diagnosis present

## 2014-04-01 DIAGNOSIS — I10 Essential (primary) hypertension: Secondary | ICD-10-CM | POA: Diagnosis present

## 2014-04-01 DIAGNOSIS — E669 Obesity, unspecified: Secondary | ICD-10-CM | POA: Diagnosis present

## 2014-04-01 DIAGNOSIS — E119 Type 2 diabetes mellitus without complications: Secondary | ICD-10-CM | POA: Diagnosis present

## 2014-04-01 DIAGNOSIS — Z8673 Personal history of transient ischemic attack (TIA), and cerebral infarction without residual deficits: Secondary | ICD-10-CM | POA: Diagnosis not present

## 2014-04-01 DIAGNOSIS — I609 Nontraumatic subarachnoid hemorrhage, unspecified: Secondary | ICD-10-CM | POA: Diagnosis present

## 2014-04-01 DIAGNOSIS — G4733 Obstructive sleep apnea (adult) (pediatric): Secondary | ICD-10-CM | POA: Diagnosis present

## 2014-04-01 DIAGNOSIS — H269 Unspecified cataract: Secondary | ICD-10-CM | POA: Diagnosis present

## 2014-04-01 DIAGNOSIS — Z7982 Long term (current) use of aspirin: Secondary | ICD-10-CM | POA: Diagnosis not present

## 2014-04-01 DIAGNOSIS — J9600 Acute respiratory failure, unspecified whether with hypoxia or hypercapnia: Secondary | ICD-10-CM | POA: Diagnosis present

## 2014-04-01 DIAGNOSIS — Z800 Family history of malignant neoplasm of digestive organs: Secondary | ICD-10-CM

## 2014-04-01 DIAGNOSIS — I6529 Occlusion and stenosis of unspecified carotid artery: Secondary | ICD-10-CM | POA: Diagnosis present

## 2014-04-01 DIAGNOSIS — Z888 Allergy status to other drugs, medicaments and biological substances status: Secondary | ICD-10-CM

## 2014-04-01 DIAGNOSIS — Z85828 Personal history of other malignant neoplasm of skin: Secondary | ICD-10-CM

## 2014-04-01 DIAGNOSIS — Z8249 Family history of ischemic heart disease and other diseases of the circulatory system: Secondary | ICD-10-CM

## 2014-04-01 DIAGNOSIS — R4182 Altered mental status, unspecified: Secondary | ICD-10-CM | POA: Diagnosis present

## 2014-04-01 DIAGNOSIS — I614 Nontraumatic intracerebral hemorrhage in cerebellum: Secondary | ICD-10-CM

## 2014-04-01 DIAGNOSIS — K2270 Barrett's esophagus without dysplasia: Secondary | ICD-10-CM | POA: Diagnosis present

## 2014-04-01 DIAGNOSIS — R51 Headache: Secondary | ICD-10-CM | POA: Diagnosis present

## 2014-04-01 DIAGNOSIS — E1165 Type 2 diabetes mellitus with hyperglycemia: Secondary | ICD-10-CM

## 2014-04-01 DIAGNOSIS — Z66 Do not resuscitate: Secondary | ICD-10-CM | POA: Diagnosis not present

## 2014-04-01 DIAGNOSIS — E785 Hyperlipidemia, unspecified: Secondary | ICD-10-CM | POA: Diagnosis present

## 2014-04-01 DIAGNOSIS — Z7902 Long term (current) use of antithrombotics/antiplatelets: Secondary | ICD-10-CM

## 2014-04-01 DIAGNOSIS — Z9849 Cataract extraction status, unspecified eye: Secondary | ICD-10-CM

## 2014-04-01 DIAGNOSIS — I4510 Unspecified right bundle-branch block: Secondary | ICD-10-CM | POA: Diagnosis present

## 2014-04-01 DIAGNOSIS — G911 Obstructive hydrocephalus: Secondary | ICD-10-CM | POA: Diagnosis present

## 2014-04-01 DIAGNOSIS — E559 Vitamin D deficiency, unspecified: Secondary | ICD-10-CM | POA: Diagnosis present

## 2014-04-01 DIAGNOSIS — Z87891 Personal history of nicotine dependence: Secondary | ICD-10-CM

## 2014-04-01 DIAGNOSIS — G935 Compression of brain: Secondary | ICD-10-CM | POA: Diagnosis present

## 2014-04-01 DIAGNOSIS — Z860100 Personal history of colon polyps, unspecified: Secondary | ICD-10-CM

## 2014-04-01 DIAGNOSIS — H409 Unspecified glaucoma: Secondary | ICD-10-CM | POA: Diagnosis present

## 2014-04-01 DIAGNOSIS — J9601 Acute respiratory failure with hypoxia: Secondary | ICD-10-CM

## 2014-04-01 DIAGNOSIS — R4020 Unspecified coma: Secondary | ICD-10-CM | POA: Diagnosis not present

## 2014-04-01 DIAGNOSIS — I619 Nontraumatic intracerebral hemorrhage, unspecified: Secondary | ICD-10-CM | POA: Diagnosis present

## 2014-04-01 LAB — URINE MICROSCOPIC-ADD ON

## 2014-04-01 LAB — URINALYSIS, ROUTINE W REFLEX MICROSCOPIC
BILIRUBIN URINE: NEGATIVE
GLUCOSE, UA: 250 mg/dL — AB
KETONES UR: NEGATIVE mg/dL
Leukocytes, UA: NEGATIVE
Nitrite: NEGATIVE
Protein, ur: 30 mg/dL — AB
Specific Gravity, Urine: 1.017 (ref 1.005–1.030)
UROBILINOGEN UA: 1 mg/dL (ref 0.0–1.0)
pH: 6.5 (ref 5.0–8.0)

## 2014-04-01 LAB — CBC WITH DIFFERENTIAL/PLATELET
BASOS PCT: 1 % (ref 0–1)
Basophils Absolute: 0 10*3/uL (ref 0.0–0.1)
EOS ABS: 0.1 10*3/uL (ref 0.0–0.7)
Eosinophils Relative: 1 % (ref 0–5)
HEMATOCRIT: 45.3 % (ref 39.0–52.0)
Hemoglobin: 15.1 g/dL (ref 13.0–17.0)
Lymphocytes Relative: 19 % (ref 12–46)
Lymphs Abs: 1.2 10*3/uL (ref 0.7–4.0)
MCH: 33.1 pg (ref 26.0–34.0)
MCHC: 33.3 g/dL (ref 30.0–36.0)
MCV: 99.3 fL (ref 78.0–100.0)
MONO ABS: 0.7 10*3/uL (ref 0.1–1.0)
Monocytes Relative: 12 % (ref 3–12)
Neutro Abs: 4.2 10*3/uL (ref 1.7–7.7)
Neutrophils Relative %: 67 % (ref 43–77)
Platelets: 187 10*3/uL (ref 150–400)
RBC: 4.56 MIL/uL (ref 4.22–5.81)
RDW: 13 % (ref 11.5–15.5)
WBC: 6.2 10*3/uL (ref 4.0–10.5)

## 2014-04-01 LAB — PROTIME-INR
INR: 1.03 (ref 0.00–1.49)
Prothrombin Time: 13.5 seconds (ref 11.6–15.2)

## 2014-04-01 LAB — COMPREHENSIVE METABOLIC PANEL
ALT: 61 U/L — ABNORMAL HIGH (ref 0–53)
AST: 52 U/L — ABNORMAL HIGH (ref 0–37)
Albumin: 3.7 g/dL (ref 3.5–5.2)
Alkaline Phosphatase: 45 U/L (ref 39–117)
Anion gap: 13 (ref 5–15)
BILIRUBIN TOTAL: 0.5 mg/dL (ref 0.3–1.2)
BUN: 14 mg/dL (ref 6–23)
CHLORIDE: 98 meq/L (ref 96–112)
CO2: 25 meq/L (ref 19–32)
Calcium: 8.5 mg/dL (ref 8.4–10.5)
Creatinine, Ser: 0.95 mg/dL (ref 0.50–1.35)
GFR, EST NON AFRICAN AMERICAN: 79 mL/min — AB (ref >90)
GLUCOSE: 193 mg/dL — AB (ref 70–99)
Potassium: 4.9 mEq/L (ref 3.7–5.3)
SODIUM: 136 meq/L — AB (ref 137–147)
Total Protein: 7.1 g/dL (ref 6.0–8.3)

## 2014-04-01 LAB — I-STAT CG4 LACTIC ACID, ED: Lactic Acid, Venous: 1.47 mmol/L (ref 0.5–2.2)

## 2014-04-01 LAB — GLUCOSE, CAPILLARY: GLUCOSE-CAPILLARY: 161 mg/dL — AB (ref 70–99)

## 2014-04-01 LAB — I-STAT CHEM 8, ED
BUN: 15 mg/dL (ref 6–23)
CREATININE: 1 mg/dL (ref 0.50–1.35)
Calcium, Ion: 1.12 mmol/L — ABNORMAL LOW (ref 1.13–1.30)
Chloride: 100 mEq/L (ref 96–112)
GLUCOSE: 200 mg/dL — AB (ref 70–99)
HCT: 50 % (ref 39.0–52.0)
HEMOGLOBIN: 17 g/dL (ref 13.0–17.0)
POTASSIUM: 4.6 meq/L (ref 3.7–5.3)
SODIUM: 136 meq/L — AB (ref 137–147)
TCO2: 24 mmol/L (ref 0–100)

## 2014-04-01 LAB — I-STAT ARTERIAL BLOOD GAS, ED
ACID-BASE DEFICIT: 2 mmol/L (ref 0.0–2.0)
BICARBONATE: 24 meq/L (ref 20.0–24.0)
O2 SAT: 100 %
PO2 ART: 182 mmHg — AB (ref 80.0–100.0)
Patient temperature: 98.2
TCO2: 25 mmol/L (ref 0–100)
pCO2 arterial: 46.1 mmHg — ABNORMAL HIGH (ref 35.0–45.0)
pH, Arterial: 7.323 — ABNORMAL LOW (ref 7.350–7.450)

## 2014-04-01 LAB — ABO/RH: ABO/RH(D): A POS

## 2014-04-01 LAB — TYPE AND SCREEN
ABO/RH(D): A POS
ANTIBODY SCREEN: NEGATIVE

## 2014-04-01 LAB — I-STAT TROPONIN, ED: Troponin i, poc: 0.01 ng/mL (ref 0.00–0.08)

## 2014-04-01 LAB — TROPONIN I

## 2014-04-01 MED ORDER — ETOMIDATE 2 MG/ML IV SOLN
INTRAVENOUS | Status: AC
  Administered 2014-04-01: 20 mg
  Filled 2014-04-01: qty 20

## 2014-04-01 MED ORDER — SODIUM CHLORIDE 0.9 % IV SOLN: Freq: Once | INTRAVENOUS | Status: DC

## 2014-04-01 MED ORDER — PANTOPRAZOLE SODIUM 40 MG IV SOLR: 40.0000 mg | INTRAVENOUS | Status: DC

## 2014-04-01 MED ORDER — LABETALOL HCL 5 MG/ML IV SOLN: 10.0000 mg | INTRAVENOUS | Status: DC | PRN

## 2014-04-01 MED ORDER — STROKE: EARLY STAGES OF RECOVERY BOOK
Freq: Once | Status: DC
  Filled 2014-04-01: qty 1

## 2014-04-01 MED ORDER — ACETAMINOPHEN 650 MG RE SUPP: 650.0000 mg | RECTAL | Status: DC | PRN

## 2014-04-01 MED ORDER — CHLORHEXIDINE GLUCONATE 0.12 % MT SOLN
15.0000 mL | Freq: Two times a day (BID) | OROMUCOSAL | Status: DC
  Administered 2014-04-02 (×3): 15 mL via OROMUCOSAL
  Filled 2014-04-01 (×5): qty 15

## 2014-04-01 MED ORDER — SODIUM CHLORIDE 0.9 % IV SOLN
INTRAVENOUS | Status: DC
  Administered 2014-04-01 – 2014-04-03 (×3): via INTRAVENOUS

## 2014-04-01 MED ORDER — LIDOCAINE HCL (CARDIAC) 20 MG/ML IV SOLN
INTRAVENOUS | Status: AC
  Filled 2014-04-01: qty 5

## 2014-04-01 MED ORDER — ROCURONIUM BROMIDE 50 MG/5ML IV SOLN
INTRAVENOUS | Status: AC
  Administered 2014-04-01: 80 mg
  Filled 2014-04-01: qty 2

## 2014-04-01 MED ORDER — FENTANYL CITRATE 0.05 MG/ML IJ SOLN: 50.0000 ug | INTRAMUSCULAR | Status: DC | PRN

## 2014-04-01 MED ORDER — PANTOPRAZOLE SODIUM 40 MG IV SOLR
40.0000 mg | Freq: Every day | INTRAVENOUS | Status: DC
  Administered 2014-04-02 (×2): 40 mg via INTRAVENOUS
  Filled 2014-04-01 (×3): qty 40

## 2014-04-01 MED ORDER — SUCCINYLCHOLINE CHLORIDE 20 MG/ML IJ SOLN
INTRAMUSCULAR | Status: AC
  Filled 2014-04-01: qty 1

## 2014-04-01 MED ORDER — SENNOSIDES-DOCUSATE SODIUM 8.6-50 MG PO TABS
1.0000 | ORAL_TABLET | Freq: Two times a day (BID) | ORAL | Status: DC
  Administered 2014-04-02 (×2): 1 via ORAL
  Filled 2014-04-01 (×4): qty 1

## 2014-04-01 MED ORDER — ACETAMINOPHEN 325 MG PO TABS: 650.0000 mg | ORAL_TABLET | ORAL | Status: DC | PRN

## 2014-04-01 MED ORDER — ACETAMINOPHEN 160 MG/5ML PO SOLN
650.0000 mg | Freq: Four times a day (QID) | ORAL | Status: DC | PRN
  Administered 2014-04-02 (×2): 650 mg
  Filled 2014-04-01 (×2): qty 20.3

## 2014-04-01 MED ORDER — FENTANYL CITRATE 0.05 MG/ML IJ SOLN
50.0000 ug | INTRAMUSCULAR | Status: DC | PRN
Start: 2014-04-01 — End: 2014-04-02

## 2014-04-01 MED ORDER — BIOTENE DRY MOUTH MT LIQD
15.0000 mL | Freq: Four times a day (QID) | OROMUCOSAL | Status: DC
  Administered 2014-04-02 – 2014-04-03 (×5): 15 mL via OROMUCOSAL

## 2014-04-01 MED ORDER — INSULIN ASPART 100 UNIT/ML ~~LOC~~ SOLN
0.0000 [IU] | SUBCUTANEOUS | Status: DC
  Administered 2014-04-02 (×2): 2 [IU] via SUBCUTANEOUS

## 2014-04-01 NOTE — ED Notes (Signed)
Patient being ventilated at this time by BVM. Resp 17. O2 @ 94% Dr. Reather Converse at bedside.

## 2014-04-01 NOTE — Op Note (Signed)
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  Asst. Surgeon:  none   Anesthesia: General  Indication: 78 year old male presents with large intraventricular hemorrhage and left inferior cerebellar hemorrhage. Patient with obstructive hydrocephalus and minimal neurologic exam. Plan ventriculostomy for relief of obstructive hydrocephalus and hopeful improvement of symptoms. Risks and benefits explained. Family agrees to proceed.  Operative note:Right scalp prepped and draped sterilely. Local lidocaine infiltrated. Incision made approximately 1 cm anterior to the coronal suture. This needle in the midpupillary line. Twist drill hole made. Dura appears. The colostomy catheter passed easily into the right lateral ventricle with relief of bloody CSF under large pressure. Tube sutured in place and connected to an external drainage system. No apparent complications.

## 2014-04-01 NOTE — ED Provider Notes (Signed)
CSN: [id number]     Arrival date & time 03/22/2014  1929 History   First MD Initiated Contact with Patient 04/11/2014 1957     Chief Complaint  Patient presents with  . Altered Mental Status     (Consider location/radiation/quality/duration/timing/severity/associated sxs/prior Treatment) HPI Comments: 78 year old male with history of sleep apnea, high blood pressure, lipids presents with EMS after severe headache followed by apnea episode. Patient felt well yesterday per her report and say he called his significant other because he had a severe headache and she arrived and he was lying in the sella floor soft spoken with severe headache, difficult to tell if any focal abnormality at the time. Per EMS early on route to be an appendectomy and general lethargy and was intubated. Patient doesn't have a known history of stroke or brain bleed. Patient did not complaint of chest pain shortness of breath past few days per report. EKG on route no acute STEMI. Patient is given Versed 5 mg on route.  Patient is a 78 y.o. male presenting with altered mental status. The history is provided by the patient.  Altered Mental Status   Past Medical History  Diagnosis Date  . 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 Unspecified vitamin D deficiency   . Obstructive sleep apnea (adult) (pediatric)   . Shortness of breath   . Transient ischemic attack (TIA), and cerebral infarction without residual deficits(V12.54)   . Other dyspnea and respiratory abnormality   . Right bundle branch block   . Type II or unspecified type diabetes mellitus without mention of complication, not stated as uncontrolled   . Unspecified essential hypertension   . Hypertrophy of prostate without urinary obstruction and other lower urinary tract symptoms (LUTS)   . Other and unspecified hyperlipidemia   . Reflux esophagitis   . Occlusion and stenosis of carotid artery without mention of cerebral infarction   .  Barrett's esophagus   . Diverticulosis of colon (without mention of hemorrhage)   . Benign neoplasm of colon   . Unspecified glaucoma   . Cataract 1979    O.S. Dr. Davis Gourd  . Unspecified cataract 1984    O.D Dr. Ishmael Holter  . Obesity, unspecified   . Reflux esophagitis    Past Surgical History  Procedure Laterality Date  . Rectal surgery  1983 & 1985    Drained Dr. Modena Morrow  . Colonoscopy  04/19/2007    Dr Lajoyce Corners  . Bb injury o.s.   1947  . Vasectomy  1972  . Cataract extraction Left 1979    Dr Davis Gourd  . Cataract extraction Right 1984 & 1998    Dr Ishmael Holter  . Neck lesion biopsy  1988    excise neck lump- Dr Elwyn Reach  . Hernia repair Right 08/1993    Dr.T. Davis  . Flexible sigmoidoscopy  11/1998    internal hemrrhoids, simoid polyp, diverticulosis- Dr Nyoka Cowden  . Esophagogastroduodenoscopy  04/19/2007    Esophageal ulcer, Barett's Esophagus  . Spirometry  02/25/2010    moderate airflow limitation   Family History  Problem Relation Age of Onset  . Heart disease Mother   . Pneumonia Mother   . Heart disease Brother   . CVA Father   . Cancer Father     tongue cancer   History  Substance Use Topics  . Smoking status: Former Smoker    Types: Cigarettes, Pipe, Landscape architect  . Smokeless tobacco: Not on file  . Alcohol Use: Yes  Review of Systems  Unable to perform ROS: Patient unresponsive      Allergies  Cortisone and Piperacetazine  Home Medications   Prior to Admission medications   Medication Sig Start Date End Date Taking? Authorizing Provider  augmented betamethasone dipropionate (DIPROLENE AF) 0.05 % cream Apply topically daily. Apply sparingly to affected area once daily.    Historical Provider, MD  clopidogrel (PLAVIX) 75 MG tablet Take 75 mg by mouth daily. Take 1 tablet  daily to prevent stroke.    Historical Provider, MD  clotrimazole (CLOTRIMAZOLE ANTI-FUNGAL) 1 % cream Apply 1 application topically as needed. 05/08/13   Estill Dooms, MD  dorzolamide-timolol  (COSOPT) 22.3-6.8 MG/ML ophthalmic solution Place 1 drop into both eyes 2 (two) times daily.    Historical Provider, MD  finasteride (PROSCAR) 5 MG tablet Take 5 mg by mouth daily. Take 1 tablet once daily for urinary dribbling/prostate enlargement.    Historical Provider, MD  fluocinonide cream (LIDEX) 0.05 % Use as directed in the mouth or throat. Use 3-4 times daily as needed    Historical Provider, MD  lisinopril (PRINIVIL,ZESTRIL) 40 MG tablet TAKE 1/2 TABLET BY MOUTH ONCE A DAY FOR BLOOD PRESSURE 09/27/13   Estill Dooms, MD  pantoprazole (PROTONIX) 40 MG tablet Take 40 mg by mouth daily. Take 1 tablet once daily for reflux.    Historical Provider, MD  simvastatin (ZOCOR) 20 MG tablet TAKE 1/2 TABLET BY MOUTH AT BEDTIME FOR CHOLESTEROL 02/19/14   Blanchie Serve, MD  tamsulosin (FLOMAX) 0.4 MG CAPS capsule Take 0.4 mg by mouth at bedtime. Take 1 capsule by mouth at bedtime for prostate.    Historical Provider, MD  vardenafil (LEVITRA) 20 MG tablet Take 20 mg by mouth daily as needed for erectile dysfunction. Take 1 tablet  prior to intercourse.    Historical Provider, MD   BP 138/68  Pulse 91  Temp(Src) 98.2 F (36.8 C) (Temporal)  Resp 20  SpO2 99% Physical Exam  Nursing note and vitals reviewed. Constitutional: He appears well-developed and well-nourished.  HENT:  Head: Normocephalic and atraumatic.  Eyes: Conjunctivae are normal. Right eye exhibits no discharge. Left eye exhibits no discharge.  Neck: Normal range of motion. Neck supple. No tracheal deviation present.  Cardiovascular: Normal rate and regular rhythm.   Pulmonary/Chest: He has rales (few bilateral bases).  Abdominal: Soft. He exhibits no distension. There is no tenderness. There is no guarding.  Musculoskeletal: He exhibits no edema.  Neurological: He is unresponsive. GCS eye subscore is 1. GCS verbal subscore is 1. GCS motor subscore is 2.  Mild extension bilateral to painful stimuli. He was not reactive. Patient not  responsive. No respirations.  Skin: Skin is warm. No rash noted.  Psychiatric:  unresponsive    ED Course  Procedures (including critical care time) CRITICAL CARE Performed by: Mariea Clonts   Total critical care time: 60 min  Critical care time was exclusive of separately billable procedures and treating other patients.  Critical care was necessary to treat or prevent imminent or life-threatening deterioration.  Critical care was time spent personally by me on the following activities: development of treatment plan with patient and/or surrogate as well as nursing, discussions with consultants, evaluation of patient's response to treatment, examination of patient, obtaining history from patient or surrogate, ordering and performing treatments and interventions, ordering and review of laboratory studies, ordering and review of radiographic studies, pulse oximetry and re-evaluation of patient's condition.  INTUBATION Performed by: Mariea Clonts  Required items: required blood products, implants, devices, and special equipment available Patient identity confirmed: provided demographic data and hospital-assigned identification number Time out: Immediately prior to procedure a "time out" was called to verify the correct patient, procedure, equipment, support staff.  Indications: unresponsive Intubation method: video Preoxygenation: BVM Sedatives: Etomidate Paralytic: roc Tube Size: 7.5 cuffed  Post-procedure assessment: chest rise and ETCO2 monitor Breath sounds: equal and absent over the epigastrium Tube secured with: ETT holder Chest x-ray interpreted by me.  Chest x-ray findings: endotracheal tube in appropriate position  Patient tolerated the procedure well with no immediate complications.    EMERGENCY DEPARTMENT [place] CARDIAC EXAM "Study: Limited Ultrasound of the heart and pericardium"  INDICATIONS: unresponsive Multiple views of the heart and pericardium were  obtained in real-time with a multi-frequency probe.  PERFORMED WC:BJSEGB  IMAGES ARCHIVED?: Yes  FINDINGS: No pericardial effusion and Decreased contractility  LIMITATIONS:  Emergent procedure  VIEWS USED: Parasternal long axis  INTERPRETATION: Cardiac activity present and Pericardial effusioin absent   Labs Review Labs Reviewed  COMPREHENSIVE METABOLIC PANEL - Abnormal; Notable for the following:    Sodium 136 (*)    Glucose, Bld 193 (*)    AST 52 (*)    ALT 61 (*)    GFR calc non Af Amer 79 (*)    All other components within normal limits  URINALYSIS, ROUTINE W REFLEX MICROSCOPIC - Abnormal; Notable for the following:    Glucose, UA 250 (*)    Hgb urine dipstick LARGE (*)    Protein, ur 30 (*)    All other components within normal limits  URINE MICROSCOPIC-ADD ON - Abnormal; Notable for the following:    Squamous Epithelial / LPF FEW (*)    All other components within normal limits  GLUCOSE, CAPILLARY - Abnormal; Notable for the following:    Glucose-Capillary 161 (*)    All other components within normal limits  GLUCOSE, CAPILLARY - Abnormal; Notable for the following:    Glucose-Capillary 136 (*)    All other components within normal limits  I-STAT CHEM 8, ED - Abnormal; Notable for the following:    Sodium 136 (*)    Glucose, Bld 200 (*)    Calcium, Ion 1.12 (*)    All other components within normal limits  I-STAT ARTERIAL BLOOD GAS, ED - Abnormal; Notable for the following:    pH, Arterial 7.323 (*)    pCO2 arterial 46.1 (*)    pO2, Arterial 182.0 (*)    All other components within normal limits  MRSA PCR SCREENING  CULTURE, RESPIRATORY (NON-EXPECTORATED)  PROTIME-INR  CBC WITH DIFFERENTIAL  TROPONIN I  BLOOD GAS, ARTERIAL  BLOOD GAS, ARTERIAL  BASIC METABOLIC PANEL  CBC  I-STAT CG4 LACTIC ACID, ED  I-STAT TROPOININ, ED  TYPE AND SCREEN  ABO/RH    Imaging Review Ct Head Wo Contrast  03/28/2014   CLINICAL DATA:  Altered mental status found down  with diaphoresis and weakness. Headache.  EXAM: CT HEAD WITHOUT CONTRAST  CT CERVICAL SPINE WITHOUT CONTRAST  TECHNIQUE: Multidetector CT imaging of the head and cervical spine was performed following the standard protocol without intravenous contrast. Multiplanar CT image reconstructions of the cervical spine were also generated.  COMPARISON:  Head CT 11/09/2009.  FINDINGS: CT HEAD FINDINGS  There is an acute hematoma involving the inferior left cerebellar hemisphere and vermis, measuring 3.6 x 3.4 cm on image 9. There is associated intraventricular hemorrhage involving the fourth, third and lateral ventricles. In addition, there is subarachnoid  hemorrhage inferior to the cerebellum, surrounding the cervical spinal cord. A small amount of blood tracks along the inferior surface of the tentorium. Mild hydrocephalus is present.  There is low density throughout the brainstem worrisome for edema. No focal infarct identified. There are no acute supratentorial abnormalities or midline shift.  Patient is intubated. There is ethmoid sinus mucosal thickening. The mastoid air cells are clear. The calvarium is intact.  CT CERVICAL SPINE FINDINGS  The cervical alignment is normal. There is no evidence of acute fracture or traumatic subluxation. There is multilevel spondylosis with disc space loss, uncinate spurring and facet hypertrophy, most advanced from C4-5 through C6-7.  There is inferior extension of subarachnoid hemorrhage into the cervical spinal canal. No cord compression evident tracheostomy, nasogastric tube and diffuse vascular calcifications noted.  IMPRESSION: 1. Acute hemorrhage within the inferior left cerebellar hemisphere and vermis, likely hypertensive. There is associated intraventricular hemorrhage, subarachnoid hemorrhage and hydrocephalus. 2. Probable diffuse brainstem edema, implying a poor prognosis. 3. Subarachnoid blood extends into the cervical spinal canal. 4. No evidence of acute cervical spine  fracture, traumatic subluxation or static signs of instability. Diffuse spondylosis. 5. Critical Value/emergent results were called by telephone at the time of interpretation on 03/24/2014 at 8:37 pm to Dr. Elnora Morrison , who verbally acknowledged these results.   Electronically Signed   By: Camie Patience M.D.   On: 03/30/2014 20:43   Ct Cervical Spine Wo Contrast  03/18/2014   CLINICAL DATA:  Altered mental status found down with diaphoresis and weakness. Headache.  EXAM: CT HEAD WITHOUT CONTRAST  CT CERVICAL SPINE WITHOUT CONTRAST  TECHNIQUE: Multidetector CT imaging of the head and cervical spine was performed following the standard protocol without intravenous contrast. Multiplanar CT image reconstructions of the cervical spine were also generated.  COMPARISON:  Head CT 11/09/2009.  FINDINGS: CT HEAD FINDINGS  There is an acute hematoma involving the inferior left cerebellar hemisphere and vermis, measuring 3.6 x 3.4 cm on image 9. There is associated intraventricular hemorrhage involving the fourth, third and lateral ventricles. In addition, there is subarachnoid hemorrhage inferior to the cerebellum, surrounding the cervical spinal cord. A small amount of blood tracks along the inferior surface of the tentorium. Mild hydrocephalus is present.  There is low density throughout the brainstem worrisome for edema. No focal infarct identified. There are no acute supratentorial abnormalities or midline shift.  Patient is intubated. There is ethmoid sinus mucosal thickening. The mastoid air cells are clear. The calvarium is intact.  CT CERVICAL SPINE FINDINGS  The cervical alignment is normal. There is no evidence of acute fracture or traumatic subluxation. There is multilevel spondylosis with disc space loss, uncinate spurring and facet hypertrophy, most advanced from C4-5 through C6-7.  There is inferior extension of subarachnoid hemorrhage into the cervical spinal canal. No cord compression evident tracheostomy,  nasogastric tube and diffuse vascular calcifications noted.  IMPRESSION: 1. Acute hemorrhage within the inferior left cerebellar hemisphere and vermis, likely hypertensive. There is associated intraventricular hemorrhage, subarachnoid hemorrhage and hydrocephalus. 2. Probable diffuse brainstem edema, implying a poor prognosis. 3. Subarachnoid blood extends into the cervical spinal canal. 4. No evidence of acute cervical spine fracture, traumatic subluxation or static signs of instability. Diffuse spondylosis. 5. Critical Value/emergent results were called by telephone at the time of interpretation on 03/20/2014 at 8:37 pm to Dr. Elnora Morrison , who verbally acknowledged these results.   Electronically Signed   By: Camie Patience M.D.   On: 03/31/2014 20:43   Dg  Chest Portable 1 View  03/25/2014   CLINICAL DATA:  Altered mental status.  Unresponsive.  Intubation.  EXAM: PORTABLE CHEST - 1 VIEW  COMPARISON:  11/09/2009  FINDINGS: Endotracheal tube is 4 cm above the carina. NG tube enters the stomach. Patchy airspace disease within the left lung, new since prior study. Cannot exclude infection or aspiration. No confluent opacity on the right. Small left pleural effusion. Mild cardiomegaly.  IMPRESSION: Patchy left lung airspace disease with small left effusion. Cannot exclude infection or aspiration.  Endotracheal tube 4 cm above the carina.   Electronically Signed   By: Rolm Baptise M.D.   On: 03/17/2014 20:21     EKG Interpretation None      MDM   Final diagnoses:  Nontraumatic intracerebral haemorrhage of cerebellum  Obstructive hydrocephalus  Acute respiratory failure with hypoxemia  Barrett's esophagus  Type 2 diabetes mellitus with hyperglycemia  Unspecified essential hypertension   Patient presented after severe headache and becoming unresponsive. Clinically concern for brain bleed. CT head ordered stat, blood work and discuss severity the situation with the family. On arrival patient has  agonal respirations despite intubation, glide scope used to checked intubation and in the esophagus. Patient was intubated with glide scope one attempt without difficulty. Discuss with radiology cerebellar bleed and updated family, discussed with neurosurgery who assessed immediately in the ER, holding on mannitol at this time. Blood pressure 001 systolic. Patient did not improve with neurologic status in ER. ICU and neurosurgery following for critical care admission.  The patients results and plan were reviewed and discussed.   Any x-rays performed were personally reviewed by myself.   Differential diagnosis were considered with the presenting HPI.  Medications  lidocaine (cardiac) 100 mg/72ml (XYLOCAINE) 20 MG/ML injection 2% (not administered)  succinylcholine (ANECTINE) 20 MG/ML injection (not administered)  0.9 %  sodium chloride infusion (not administered)   stroke: mapping our early stages of recovery book (not administered)  senna-docusate (Senokot-S) tablet 1 tablet (not administered)  labetalol (NORMODYNE,TRANDATE) injection 10-40 mg (not administered)  pantoprazole (PROTONIX) injection 40 mg (40 mg Intravenous Given 04/02/14 0005)  fentaNYL (SUBLIMAZE) injection 50 mcg (not administered)  chlorhexidine (PERIDEX) 0.12 % solution 15 mL (15 mLs Mouth Rinse Given 04/02/14 0005)  antiseptic oral rinse (BIOTENE) solution 15 mL (0 mLs Mouth Rinse Duplicate 7/49/44 9675)  acetaminophen (TYLENOL) solution 650 mg (not administered)    Or  acetaminophen (TYLENOL) suppository 650 mg (not administered)  insulin aspart (novoLOG) injection 0-15 Units (2 Units Subcutaneous Given 04/02/14 0011)  0.9 %  sodium chloride infusion ( Intravenous Rate/Dose Verify 04/02/14 0100)  rocuronium (ZEMURON) 50 MG/5ML injection (80 mg  Given 03/31/2014 1940)  etomidate (AMIDATE) 2 MG/ML injection (20 mg  Given 03/20/2014 1939)     Filed Vitals:   03/31/2014 2327 04/02/14 0000 04/02/14 0028 04/02/14 0100  BP:  110/56   122/62  Pulse:  57 58 57  Temp: 96.8 F (36 C) 97 F (36.1 C)  97.2 F (36.2 C)  TempSrc: Core (Comment)     Resp:  18 18 18   SpO2:  100% 100% 100%    Admission/ observation were discussed with the admitting physician, patient and/or family and they are comfortable with the plan.      Mariea Clonts, MD 04/10/14 [phone]

## 2014-04-01 NOTE — ED Notes (Signed)
Patient arrives via [facility]. Patient called neighbor stating that he "didnt feel well and that he had a headache." Upon EMS arrival patient was lying in floor: diaphoretic and c/o of being weak and a headache." Patient became unresponsive with agonal respirations during EMS assessment. EMS intubated patient in field- NSR 72 bpm. BP 139/84.  5mg  versed given by EMS. EKG unremarkable per EMS. Patient states that patient had no neuro deficits noted before patient became unresponsive.  139 CBG

## 2014-04-01 NOTE — Consult Note (Addendum)
Reason for Consult:Unresponsive  Referring Physician: Reather Converse  CC: Unresponsive  HPI: Brett Hartman is an 78 y.o. male who lives alone.  Called his neighbor this evening and reported that he did not feel well.  Stated that he had a headache.  EMS was called and the patient was found lying on the floor.  Per EMS he was diaphoretic and complained of being weak and having a headache.  Patient then became unresponsive with agonal respirations.  Patient was intubated in the field and brought to [facility] for further evaluation.  Past Medical History  Diagnosis Date  . 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 Unspecified vitamin D deficiency   . Obstructive sleep apnea (adult) (pediatric)   . Shortness of breath   . Transient ischemic attack (TIA), and cerebral infarction without residual deficits(V12.54)   . Other dyspnea and respiratory abnormality   . Right bundle branch block   . Type II or unspecified type diabetes mellitus without mention of complication, not stated as uncontrolled   . Unspecified essential hypertension   . Hypertrophy of prostate without urinary obstruction and other lower urinary tract symptoms (LUTS)   . Other and unspecified hyperlipidemia   . Reflux esophagitis   . Occlusion and stenosis of carotid artery without mention of cerebral infarction   . Barrett's esophagus   . Diverticulosis of colon (without mention of hemorrhage)   . Benign neoplasm of colon   . Unspecified glaucoma   . Cataract 1979    O.S. Dr. Davis Gourd  . Unspecified cataract 1984    O.D Dr. Ishmael Holter  . Obesity, unspecified   . Reflux esophagitis     Past Surgical History  Procedure Laterality Date  . Rectal surgery  1983 & 1985    Drained Dr. Modena Morrow  . Colonoscopy  04/19/2007    Dr Lajoyce Corners  . Bb injury o.s.   1947  . Vasectomy  1972  . Cataract extraction Left 1979    Dr Davis Gourd  . Cataract extraction Right 1984 & 1998    Dr Ishmael Holter  . Neck lesion biopsy  1988    excise neck  lump- Dr Elwyn Reach  . Hernia repair Right 08/1993    Dr.T. Davis  . Flexible sigmoidoscopy  11/1998    internal hemrrhoids, simoid polyp, diverticulosis- Dr Nyoka Cowden  . Esophagogastroduodenoscopy  04/19/2007    Esophageal ulcer, Barett's Esophagus  . Spirometry  02/25/2010    moderate airflow limitation    Family History  Problem Relation Age of Onset  . Heart disease Mother   . Pneumonia Mother   . Heart disease Brother   . CVA Father   . Cancer Father     tongue cancer    Social History:  reports that he has quit smoking. His smoking use included Cigarettes, Pipe, and Cigars. He smoked 0.00 packs per day. He does not have any smokeless tobacco history on file. He reports that he drinks alcohol. He reports that he does not use illicit drugs.  Allergies  Allergen Reactions  . Cortisone   . Piperacetazine Other (See Comments)    Dry Mouth    Medications: I have reviewed the patient's current medications. Prior to Admission:  Current outpatient prescriptions: augmented betamethasone dipropionate (DIPROLENE AF) 0.05 % cream, Apply topically daily. Apply sparingly to affected area once daily., Disp: , Rfl: ;   clopidogrel (PLAVIX) 75 MG tablet, Take 75 mg by mouth daily. Take 1 tablet  daily to prevent stroke., Disp: ,  Rfl: ;   clotrimazole (CLOTRIMAZOLE ANTI-FUNGAL) 1 % cream, Apply 1 application topically as needed., Disp: 30 g, Rfl: 5 dorzolamide-timolol (COSOPT) 22.3-6.8 MG/ML ophthalmic solution, Place 1 drop into both eyes 2 (two) times daily., Disp: , Rfl: ;   finasteride (PROSCAR) 5 MG tablet, Take 5 mg by mouth daily. Take 1 tablet once daily for urinary dribbling/prostate enlargement., Disp: , Rfl: ;  fluocinonide cream (LIDEX) 0.05 %, Use as directed in the mouth or throat. Use 3-4 times daily as needed, Disp: , Rfl:  lisinopril (PRINIVIL,ZESTRIL) 40 MG tablet, TAKE 1/2 TABLET BY MOUTH ONCE A DAY FOR BLOOD PRESSURE, Disp: 15 tablet, Rfl: 3;  pantoprazole (PROTONIX) 40 MG tablet,  Take 40 mg by mouth daily. Take 1 tablet once daily for reflux., Disp: , Rfl: ;   simvastatin (ZOCOR) 20 MG tablet, TAKE 1/2 TABLET BY MOUTH AT BEDTIME FOR CHOLESTEROL, Disp: 45 tablet, Rfl: 1 tamsulosin (FLOMAX) 0.4 MG CAPS capsule, Take 0.4 mg by mouth at bedtime. Take 1 capsule by mouth at bedtime for prostate., Disp: , Rfl: ;  vardenafil (LEVITRA) 20 MG tablet, Take 20 mg by mouth daily as needed for erectile dysfunction. Take 1 tablet  prior to intercourse., Disp: , Rfl:   ROS: Unable to obtain secondary to mental status  Physical Examination: Blood pressure 152/77, pulse 67, temperature 98.2 F (36.8 C), temperature source Temporal, resp. rate 18, SpO2 99.00%.  Neurologic Examination Mental Status: Patient does not respond to verbal stimuli.  Does not respond to deep sternal rub.  Does not follow commands.  No verbalizations noted.  Cranial Nerves: II: patient does not respond confrontation bilaterally, pupils right 3 mm, left 3 mm,and unreactive bilaterally III,IV,VI: doll's response absent bilaterally.  V,VII: corneal reflex absent bilaterally  VIII: patient does not respond to verbal stimuli IX,X: gag reflex absent, XI: trapezius strength unable to test bilaterally XII: tongue strength unable to test Motor: Extremities flaccid throughout.  No spontaneous movement noted.  No purposeful movements noted. Sensory: Does not respond to noxious stimuli in any extremity. Deep Tendon Reflexes:  1+ in the upper extremities and at the left KJ.  Otherwise DTR's were absent.   Plantars: upgoing bilaterally Cerebellar: Unable to perform    Laboratory Studies:   Basic Metabolic Panel:  Recent Labs Lab 04/07/2014 1937 03/18/2014 1951  NA 136* 136*  K 4.9 4.6  CL 98 100  CO2 25  --   GLUCOSE 193* 200*  BUN 14 15  CREATININE 0.95 1.00  CALCIUM 8.5  --     Liver Function Tests:  Recent Labs Lab 03/25/2014 1937  AST 52*  ALT 61*  ALKPHOS 45  BILITOT 0.5  PROT 7.1  ALBUMIN  3.7   No results found for this basename: LIPASE, AMYLASE,  in the last 168 hours No results found for this basename: AMMONIA,  in the last 168 hours  CBC:  Recent Labs Lab 03/14/2014 1937 03/25/2014 1951  WBC 6.2  --   NEUTROABS 4.2  --   HGB 15.1 17.0  HCT 45.3 50.0  MCV 99.3  --   PLT 187  --     Cardiac Enzymes:  Recent Labs Lab 03/28/2014 1937  TROPONINI <0.30    BNP: No components found with this basename: POCBNP,   CBG: No results found for this basename: GLUCAP,  in the last 168 hours  Microbiology: No results found for this or any previous visit.  Coagulation Studies:  Recent Labs  04/05/2014 1937  LABPROT 13.5  INR  1.03    Urinalysis:  Recent Labs Lab 04/12/2014 2003  COLORURINE YELLOW  LABSPEC 1.017  PHURINE 6.5  GLUCOSEU 250*  HGBUR LARGE*  BILIRUBINUR NEGATIVE  KETONESUR NEGATIVE  PROTEINUR 30*  UROBILINOGEN 1.0  NITRITE NEGATIVE  LEUKOCYTESUR NEGATIVE    Lipid Panel:     Component Value Date/Time   TRIG 104 10/17/2013 1119   HDL 43 10/17/2013 1119   CHOLHDL 3.4 10/17/2013 1119   LDLCALC 83 10/17/2013 1119    HgbA1C:  Lab Results  Component Value Date   HGBA1C 6.1* 05/08/2013    Urine Drug Screen:   No results found for this basename: labopia, cocainscrnur, labbenz, amphetmu, thcu, labbarb    Alcohol Level: No results found for this basename: ETH,  in the last 168 hours   Imaging: Ct Head Wo Contrast  03/29/2014   CLINICAL DATA:  Altered mental status found down with diaphoresis and weakness. Headache.  EXAM: CT HEAD WITHOUT CONTRAST  CT CERVICAL SPINE WITHOUT CONTRAST  TECHNIQUE: Multidetector CT imaging of the head and cervical spine was performed following the standard protocol without intravenous contrast. Multiplanar CT image reconstructions of the cervical spine were also generated.  COMPARISON:  Head CT 11/09/2009.  FINDINGS: CT HEAD FINDINGS  There is an acute hematoma involving the inferior left cerebellar hemisphere and vermis,  measuring 3.6 x 3.4 cm on image 9. There is associated intraventricular hemorrhage involving the fourth, third and lateral ventricles. In addition, there is subarachnoid hemorrhage inferior to the cerebellum, surrounding the cervical spinal cord. A small amount of blood tracks along the inferior surface of the tentorium. Mild hydrocephalus is present.  There is low density throughout the brainstem worrisome for edema. No focal infarct identified. There are no acute supratentorial abnormalities or midline shift.  Patient is intubated. There is ethmoid sinus mucosal thickening. The mastoid air cells are clear. The calvarium is intact.  CT CERVICAL SPINE FINDINGS  The cervical alignment is normal. There is no evidence of acute fracture or traumatic subluxation. There is multilevel spondylosis with disc space loss, uncinate spurring and facet hypertrophy, most advanced from C4-5 through C6-7.  There is inferior extension of subarachnoid hemorrhage into the cervical spinal canal. No cord compression evident tracheostomy, nasogastric tube and diffuse vascular calcifications noted.  IMPRESSION: 1. Acute hemorrhage within the inferior left cerebellar hemisphere and vermis, likely hypertensive. There is associated intraventricular hemorrhage, subarachnoid hemorrhage and hydrocephalus. 2. Probable diffuse brainstem edema, implying a poor prognosis. 3. Subarachnoid blood extends into the cervical spinal canal. 4. No evidence of acute cervical spine fracture, traumatic subluxation or static signs of instability. Diffuse spondylosis. 5. Critical Value/emergent results were called by telephone at the time of interpretation on 04/09/2014 at 8:37 pm to Dr. Elnora Morrison , who verbally acknowledged these results.   Electronically Signed   By: Camie Patience M.D.   On: 03/22/2014 20:43   Ct Cervical Spine Wo Contrast  04/11/2014   CLINICAL DATA:  Altered mental status found down with diaphoresis and weakness. Headache.  EXAM: CT HEAD  WITHOUT CONTRAST  CT CERVICAL SPINE WITHOUT CONTRAST  TECHNIQUE: Multidetector CT imaging of the head and cervical spine was performed following the standard protocol without intravenous contrast. Multiplanar CT image reconstructions of the cervical spine were also generated.  COMPARISON:  Head CT 11/09/2009.  FINDINGS: CT HEAD FINDINGS  There is an acute hematoma involving the inferior left cerebellar hemisphere and vermis, measuring 3.6 x 3.4 cm on image 9. There is associated intraventricular hemorrhage involving the fourth,  third and lateral ventricles. In addition, there is subarachnoid hemorrhage inferior to the cerebellum, surrounding the cervical spinal cord. A small amount of blood tracks along the inferior surface of the tentorium. Mild hydrocephalus is present.  There is low density throughout the brainstem worrisome for edema. No focal infarct identified. There are no acute supratentorial abnormalities or midline shift.  Patient is intubated. There is ethmoid sinus mucosal thickening. The mastoid air cells are clear. The calvarium is intact.  CT CERVICAL SPINE FINDINGS  The cervical alignment is normal. There is no evidence of acute fracture or traumatic subluxation. There is multilevel spondylosis with disc space loss, uncinate spurring and facet hypertrophy, most advanced from C4-5 through C6-7.  There is inferior extension of subarachnoid hemorrhage into the cervical spinal canal. No cord compression evident tracheostomy, nasogastric tube and diffuse vascular calcifications noted.  IMPRESSION: 1. Acute hemorrhage within the inferior left cerebellar hemisphere and vermis, likely hypertensive. There is associated intraventricular hemorrhage, subarachnoid hemorrhage and hydrocephalus. 2. Probable diffuse brainstem edema, implying a poor prognosis. 3. Subarachnoid blood extends into the cervical spinal canal. 4. No evidence of acute cervical spine fracture, traumatic subluxation or static signs of  instability. Diffuse spondylosis. 5. Critical Value/emergent results were called by telephone at the time of interpretation on 03/15/2014 at 8:37 pm to Dr. Elnora Morrison , who verbally acknowledged these results.   Electronically Signed   By: Camie Patience M.D.   On: 03/26/2014 20:43   Dg Chest Portable 1 View  03/26/2014   CLINICAL DATA:  Altered mental status.  Unresponsive.  Intubation.  EXAM: PORTABLE CHEST - 1 VIEW  COMPARISON:  11/09/2009  FINDINGS: Endotracheal tube is 4 cm above the carina. NG tube enters the stomach. Patchy airspace disease within the left lung, new since prior study. Cannot exclude infection or aspiration. No confluent opacity on the right. Small left pleural effusion. Mild cardiomegaly.  IMPRESSION: Patchy left lung airspace disease with small left effusion. Cannot exclude infection or aspiration.  Endotracheal tube 4 cm above the carina.   Electronically Signed   By: Rolm Baptise M.D.   On: 03/19/2014 20:21     Assessment/Plan: 78 year old male presenting unresponsive. Had previous complaints of headache.  Patient required intubation.  Now with midposition, unreactive pupils, absent oculocephalic reflexes and absent corneals.  No spontaneous movement noted.  Head CT reviewed and shows an acute inferior left cerebellar hemorrhage with intraventricular and subarachnoid extension.  Evidence of hydrocephalus apparent as well.   Recommendations: 1.  Consult Neurosurgery  2.  Admit to ICU for further management.   3.  D/C Plavix and hold all other antiplatelet and anticoagulants 4.  CCM to manage ventilator and possible aspiration pneumonia 5.  BP control  This patient is critically ill and at significant risk of neurological worsening, death and care requires constant monitoring of vital signs, hemodynamics,respiratory and cardiac monitoring, neurological assessment, discussion with family, other specialists and medical decision making of high complexity. I spent 60 minutes of  neurocritical care time  in the care of  this patient.   Alexis Goodell, MD [facility] [phone] 04/12/2014, 9:21 PM   Addendum: Case discussed with neurosurgery.  Although prognosis is poor would like to give patient full chance at recovery.  Ventriculostomy drain to be placed.  If improvement noted to consider further intervention at that time.    Alexis Goodell, MD [facility] [phone]

## 2014-04-01 NOTE — ED Notes (Signed)
Bedside report will be given per receiving RN.

## 2014-04-01 NOTE — H&P (Signed)
PULMONARY / CRITICAL CARE MEDICINE   Name: Brett Hartman MRN: [id number] DOB: Mar 10, 1936    ADMISSION DATE:  03/29/2014  REFERRING MD :  Reather Converse  CHIEF COMPLAINT: Headache  INITIAL PRESENTATION:  78 yo male former smoker developed headache and then unresponsiveness.  Found to have Lt cerebellar and vermis hematoma with IVH, SAH, and mild hydrocephalus.  Intubated for airway protection.  PCCM asked to admit to ICU.  STUDIES:  7/20 CT head >> Lt cerebellar and vermis hematoma with IVH, SAH, and mild hydrocephalus  SIGNIFICANT EVENTS: 7/20 Admit, neurology, neurosurgery consulted  HISTORY OF PRESENT ILLNESS:   78 yo male reported to his neighbor that he was not feeling well and had headache.  EMS was called, and pt found to be laying on floor and diaphoretic.  He then became comatose with agonal respiration.  He was intubated by EMS and brought to ER.  He had CT head with showed ICH in Lt cerebellum and vermis with IVH and SAH with hydrocephalus.  He has been evaluated by neurology and neurosurgery.  Neurosurgery is planning to take pt to OR for IVD.  PAST MEDICAL HISTORY :  Past Medical History  Diagnosis Date  . 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 Unspecified vitamin D deficiency   . Obstructive sleep apnea (adult) (pediatric)   . Shortness of breath   . Transient ischemic attack (TIA), and cerebral infarction without residual deficits(V12.54)   . Other dyspnea and respiratory abnormality   . Right bundle branch block   . Type II or unspecified type diabetes mellitus without mention of complication, not stated as uncontrolled   . Unspecified essential hypertension   . Hypertrophy of prostate without urinary obstruction and other lower urinary tract symptoms (LUTS)   . Other and unspecified hyperlipidemia   . Reflux esophagitis   . Occlusion and stenosis of carotid artery without mention of cerebral infarction   . Barrett's esophagus   . Diverticulosis of  colon (without mention of hemorrhage)   . Benign neoplasm of colon   . Unspecified glaucoma   . Cataract 1979    O.S. Dr. Davis Gourd  . Unspecified cataract 1984    O.D Dr. Ishmael Holter  . Obesity, unspecified   . Reflux esophagitis    Past Surgical History  Procedure Laterality Date  . Rectal surgery  1983 & 1985    Drained Dr. Modena Morrow  . Colonoscopy  04/19/2007    Dr Lajoyce Corners  . Bb injury o.s.   1947  . Vasectomy  1972  . Cataract extraction Left 1979    Dr Davis Gourd  . Cataract extraction Right 1984 & 1998    Dr Ishmael Holter  . Neck lesion biopsy  1988    excise neck lump- Dr Elwyn Reach  . Hernia repair Right 08/1993    Dr.T. Davis  . Flexible sigmoidoscopy  11/1998    internal hemrrhoids, simoid polyp, diverticulosis- Dr Nyoka Cowden  . Esophagogastroduodenoscopy  04/19/2007    Esophageal ulcer, Barett's Esophagus  . Spirometry  02/25/2010    moderate airflow limitation   Prior to Admission medications   Medication Sig Start Date End Date Taking? Authorizing Provider  augmented betamethasone dipropionate (DIPROLENE AF) 0.05 % cream Apply topically daily. Apply sparingly to affected area once daily.    Historical Provider, MD  clopidogrel (PLAVIX) 75 MG tablet Take 75 mg by mouth daily. Take 1 tablet  daily to prevent stroke.    Historical Provider, MD  clotrimazole (CLOTRIMAZOLE ANTI-FUNGAL) 1 %  cream Apply 1 application topically as needed. 05/08/13   Estill Dooms, MD  dorzolamide-timolol (COSOPT) 22.3-6.8 MG/ML ophthalmic solution Place 1 drop into both eyes 2 (two) times daily.    Historical Provider, MD  finasteride (PROSCAR) 5 MG tablet Take 5 mg by mouth daily. Take 1 tablet once daily for urinary dribbling/prostate enlargement.    Historical Provider, MD  fluocinonide cream (LIDEX) 0.05 % Use as directed in the mouth or throat. Use 3-4 times daily as needed    Historical Provider, MD  lisinopril (PRINIVIL,ZESTRIL) 40 MG tablet TAKE 1/2 TABLET BY MOUTH ONCE A DAY FOR BLOOD PRESSURE 09/27/13   Estill Dooms, MD  pantoprazole (PROTONIX) 40 MG tablet Take 40 mg by mouth daily. Take 1 tablet once daily for reflux.    Historical Provider, MD  simvastatin (ZOCOR) 20 MG tablet TAKE 1/2 TABLET BY MOUTH AT BEDTIME FOR CHOLESTEROL 02/19/14   Blanchie Serve, MD  tamsulosin (FLOMAX) 0.4 MG CAPS capsule Take 0.4 mg by mouth at bedtime. Take 1 capsule by mouth at bedtime for prostate.    Historical Provider, MD  vardenafil (LEVITRA) 20 MG tablet Take 20 mg by mouth daily as needed for erectile dysfunction. Take 1 tablet  prior to intercourse.    Historical Provider, MD   Allergies  Allergen Reactions  . Cortisone   . Piperacetazine Other (See Comments)    Dry Mouth    FAMILY HISTORY:  Family History  Problem Relation Age of Onset  . Heart disease Mother   . Pneumonia Mother   . Heart disease Brother   . CVA Father   . Cancer Father     tongue cancer   SOCIAL HISTORY:  reports that he has quit smoking. His smoking use included Cigarettes, Pipe, and Cigars. He smoked 0.00 packs per day. He does not have any smokeless tobacco history on file. He reports that he drinks alcohol. He reports that he does not use illicit drugs.  REVIEW OF SYSTEMS:   Unable to obtain due to pt's mental status  SUBJECTIVE:   VITAL SIGNS: Temp:  [98.2 F (36.8 C)] 98.2 F (36.8 C) (07/20 1936) Pulse Rate:  [67-91] 67 (07/20 2045) Resp:  [13-20] 18 (07/20 2045) BP: (138-172)/(68-89) 152/77 mmHg (07/20 2045) SpO2:  [89 %-100 %] 99 % (07/20 2045) FiO2 (%):  [100 %] 100 % (07/20 1937) HEMODYNAMICS:   VENTILATOR SETTINGS: Vent Mode:  [-] PRVC FiO2 (%):  [100 %] 100 % Set Rate:  [18 bmp] 18 bmp Vt Set:  [580 mL] 580 mL PEEP:  [5 cmH20] 5 cmH20 Plateau Pressure:  [17 cmH20] 17 cmH20 INTAKE / OUTPUT: Intake/Output   None     PHYSICAL EXAMINATION: General:  Ill appearing Neuro:  comatose HEENT:  Pupils fixed and mid point, ETT in place Cardiovascular:  Regular, no murmur Lungs: scattered rhonchi Lt >  Rt Abdomen:  Soft, non tender Musculoskeletal:  No edema Skin:  No rashes  LABS:  CBC  Recent Labs Lab 03/24/2014 1937 03/27/2014 1951  WBC 6.2  --   HGB 15.1 17.0  HCT 45.3 50.0  PLT 187  --    Coag's  Recent Labs Lab 04/11/2014 1937  INR 1.03   BMET  Recent Labs Lab 04/05/2014 1937 03/23/2014 1951  NA 136* 136*  K 4.9 4.6  CL 98 100  CO2 25  --   BUN 14 15  CREATININE 0.95 1.00  GLUCOSE 193* 200*   Electrolytes  Recent Labs Lab 03/31/2014 1937  CALCIUM 8.5   Sepsis Markers  Recent Labs Lab 04/10/2014 1951  LATICACIDVEN 1.47   ABG  Recent Labs Lab 03/19/2014 2021  PHART 7.323*  PCO2ART 46.1*  PO2ART 182.0*   Liver Enzymes  Recent Labs Lab 03/27/2014 1937  AST 52*  ALT 61*  ALKPHOS 45  BILITOT 0.5  ALBUMIN 3.7   Cardiac Enzymes  Recent Labs Lab 03/19/2014 1937  TROPONINI <0.30   Glucose No results found for this basename: GLUCAP,  in the last 168 hours  Imaging Ct Head Wo Contrast  03/31/2014   CLINICAL DATA:  Altered mental status found down with diaphoresis and weakness. Headache.  EXAM: CT HEAD WITHOUT CONTRAST  CT CERVICAL SPINE WITHOUT CONTRAST  TECHNIQUE: Multidetector CT imaging of the head and cervical spine was performed following the standard protocol without intravenous contrast. Multiplanar CT image reconstructions of the cervical spine were also generated.  COMPARISON:  Head CT 11/09/2009.  FINDINGS: CT HEAD FINDINGS  There is an acute hematoma involving the inferior left cerebellar hemisphere and vermis, measuring 3.6 x 3.4 cm on image 9. There is associated intraventricular hemorrhage involving the fourth, third and lateral ventricles. In addition, there is subarachnoid hemorrhage inferior to the cerebellum, surrounding the cervical spinal cord. A small amount of blood tracks along the inferior surface of the tentorium. Mild hydrocephalus is present.  There is low density throughout the brainstem worrisome for edema. No focal infarct  identified. There are no acute supratentorial abnormalities or midline shift.  Patient is intubated. There is ethmoid sinus mucosal thickening. The mastoid air cells are clear. The calvarium is intact.  CT CERVICAL SPINE FINDINGS  The cervical alignment is normal. There is no evidence of acute fracture or traumatic subluxation. There is multilevel spondylosis with disc space loss, uncinate spurring and facet hypertrophy, most advanced from C4-5 through C6-7.  There is inferior extension of subarachnoid hemorrhage into the cervical spinal canal. No cord compression evident tracheostomy, nasogastric tube and diffuse vascular calcifications noted.  IMPRESSION: 1. Acute hemorrhage within the inferior left cerebellar hemisphere and vermis, likely hypertensive. There is associated intraventricular hemorrhage, subarachnoid hemorrhage and hydrocephalus. 2. Probable diffuse brainstem edema, implying a poor prognosis. 3. Subarachnoid blood extends into the cervical spinal canal. 4. No evidence of acute cervical spine fracture, traumatic subluxation or static signs of instability. Diffuse spondylosis. 5. Critical Value/emergent results were called by telephone at the time of interpretation on 03/22/2014 at 8:37 pm to Dr. Elnora Morrison , who verbally acknowledged these results.   Electronically Signed   By: Camie Patience M.D.   On: 03/22/2014 20:43   Ct Cervical Spine Wo Contrast  03/20/2014   CLINICAL DATA:  Altered mental status found down with diaphoresis and weakness. Headache.  EXAM: CT HEAD WITHOUT CONTRAST  CT CERVICAL SPINE WITHOUT CONTRAST  TECHNIQUE: Multidetector CT imaging of the head and cervical spine was performed following the standard protocol without intravenous contrast. Multiplanar CT image reconstructions of the cervical spine were also generated.  COMPARISON:  Head CT 11/09/2009.  FINDINGS: CT HEAD FINDINGS  There is an acute hematoma involving the inferior left cerebellar hemisphere and vermis, measuring  3.6 x 3.4 cm on image 9. There is associated intraventricular hemorrhage involving the fourth, third and lateral ventricles. In addition, there is subarachnoid hemorrhage inferior to the cerebellum, surrounding the cervical spinal cord. A small amount of blood tracks along the inferior surface of the tentorium. Mild hydrocephalus is present.  There is low density throughout the brainstem worrisome for edema. No  focal infarct identified. There are no acute supratentorial abnormalities or midline shift.  Patient is intubated. There is ethmoid sinus mucosal thickening. The mastoid air cells are clear. The calvarium is intact.  CT CERVICAL SPINE FINDINGS  The cervical alignment is normal. There is no evidence of acute fracture or traumatic subluxation. There is multilevel spondylosis with disc space loss, uncinate spurring and facet hypertrophy, most advanced from C4-5 through C6-7.  There is inferior extension of subarachnoid hemorrhage into the cervical spinal canal. No cord compression evident tracheostomy, nasogastric tube and diffuse vascular calcifications noted.  IMPRESSION: 1. Acute hemorrhage within the inferior left cerebellar hemisphere and vermis, likely hypertensive. There is associated intraventricular hemorrhage, subarachnoid hemorrhage and hydrocephalus. 2. Probable diffuse brainstem edema, implying a poor prognosis. 3. Subarachnoid blood extends into the cervical spinal canal. 4. No evidence of acute cervical spine fracture, traumatic subluxation or static signs of instability. Diffuse spondylosis. 5. Critical Value/emergent results were called by telephone at the time of interpretation on 03/31/2014 at 8:37 pm to Dr. Elnora Morrison , who verbally acknowledged these results.   Electronically Signed   By: Camie Patience M.D.   On: 04/05/2014 20:43   Dg Chest Portable 1 View  03/23/2014   CLINICAL DATA:  Altered mental status.  Unresponsive.  Intubation.  EXAM: PORTABLE CHEST - 1 VIEW  COMPARISON:   11/09/2009  FINDINGS: Endotracheal tube is 4 cm above the carina. NG tube enters the stomach. Patchy airspace disease within the left lung, new since prior study. Cannot exclude infection or aspiration. No confluent opacity on the right. Small left pleural effusion. Mild cardiomegaly.  IMPRESSION: Patchy left lung airspace disease with small left effusion. Cannot exclude infection or aspiration.  Endotracheal tube 4 cm above the carina.   Electronically Signed   By: Rolm Baptise M.D.   On: 04/11/2014 20:21    ASSESSMENT / PLAN: NEUROLOGIC A:   ICH with IVH, SAH and hydrocephalus. P:   RASS goal: 0 Per neurology, neurosurgery  PULMONARY [place] 7/20 >> A: Acute respiratory failure 2nd to inability to protect airway. Hx of OSA. P:   Full vent support F/u CXR, ABG  CARDIOVASCULAR A:  Hx of HTN, RBBB. P:  Goal blood pressure < 160/90 PRN labetalol  RENAL A:   Hx of BPH. P:   Monitor renal fx, urine outpt, electrolytes  GASTROINTESTINAL A:   Hx of Barrett's esophagus. P:   NPO Tube feeds if unable to extubate soon Protonix for SUP  HEMATOLOGIC A:   No acute issues. P:  F/u CBC SCD for DVT prevention  INFECTIOUS A:  Possible aspiration. P:   Sputum 7/20 >>  Monitor off Abx >> low threshold to add Abx if he develops signs of infection  ENDOCRINE A:   DM type II with hyperglycemia. P:   SSI  TODAY'S SUMMARY:  78 yo with ICH, IVH, SAH, and hydrocephalus.  He will go for IVD by neurosurgery.  Prognosis seems poor for meaningful neurologic recovery.  Will need to have ongoing d/w pt family about goals of care, depending on his progression.  D/w Dr. Doy Mince.  CC time 40 minutes.  Chesley Mires, MD [facility] Pulmonary/Critical Care 03/17/2014, 9:41 PM Pager:  [phone] After 3pm call: [phone]

## 2014-04-01 NOTE — ED Notes (Signed)
Neuro surgeon finished procedure.

## 2014-04-01 NOTE — Consult Note (Signed)
Reason for Consult: Intraventricular hemorrhage/cerebellar hemorrhage Referring Physician: Emergency department  Brett Hartman is an 78 y.o. male.  HPI: 78 year old male with history of multiple medical problems including hypertension. Patient presents with a history of rapidly progressive headaches over the past 12 hours. Patient rapidly lost consciousness. It abated in the field. Transferred to [facility] emergency department where he continued to decline neurologically. Upon arrival emergency department patient would not open his eyes to noxious stimuli. He exhibited some minimal extensor posturing. No movements to command. Head CT scan demonstrated evidence of a large posterior fossa hemorrhage beginning in his left inferior cerebellar hemisphere and rupturing into his fourth ventricle. There is a large amount of intraventricular blood with secondary obstructive hydrocephalus. The patient is on aspirin at home. He has no other known anticoagulant use.  Past Medical History  Diagnosis Date  . 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 Unspecified vitamin D deficiency   . Obstructive sleep apnea (adult) (pediatric)   . Shortness of breath   . Transient ischemic attack (TIA), and cerebral infarction without residual deficits(V12.54)   . Other dyspnea and respiratory abnormality   . Right bundle branch block   . Type II or unspecified type diabetes mellitus without mention of complication, not stated as uncontrolled   . Unspecified essential hypertension   . Hypertrophy of prostate without urinary obstruction and other lower urinary tract symptoms (LUTS)   . Other and unspecified hyperlipidemia   . Reflux esophagitis   . Occlusion and stenosis of carotid artery without mention of cerebral infarction   . Barrett's esophagus   . Diverticulosis of colon (without mention of hemorrhage)   . Benign neoplasm of colon   . Unspecified glaucoma   . Cataract 1979    O.S. Dr. Davis Gourd  .  Unspecified cataract 1984    O.D Dr. Ishmael Holter  . Obesity, unspecified   . Reflux esophagitis     Past Surgical History  Procedure Laterality Date  . Rectal surgery  1983 & 1985    Drained Dr. Modena Morrow  . Colonoscopy  04/19/2007    Dr Lajoyce Corners  . Bb injury o.s.   1947  . Vasectomy  1972  . Cataract extraction Left 1979    Dr Davis Gourd  . Cataract extraction Right 1984 & 1998    Dr Ishmael Holter  . Neck lesion biopsy  1988    excise neck lump- Dr Elwyn Reach  . Hernia repair Right 08/1993    Dr.T. Davis  . Flexible sigmoidoscopy  11/1998    internal hemrrhoids, simoid polyp, diverticulosis- Dr Nyoka Cowden  . Esophagogastroduodenoscopy  04/19/2007    Esophageal ulcer, Barett's Esophagus  . Spirometry  02/25/2010    moderate airflow limitation    Family History  Problem Relation Age of Onset  . Heart disease Mother   . Pneumonia Mother   . Heart disease Brother   . CVA Father   . Cancer Father     tongue cancer    Social History:  reports that he has quit smoking. His smoking use included Cigarettes, Pipe, and Cigars. He smoked 0.00 packs per day. He does not have any smokeless tobacco history on file. He reports that he drinks alcohol. He reports that he does not use illicit drugs.  Allergies:  Allergies  Allergen Reactions  . Cortisone   . Piperacetazine Other (See Comments)    Dry Mouth    Medications: I have reviewed the patient's current medications.  Results for orders placed  during the hospital encounter of 03/29/2014 (from the past 48 hour(s))  COMPREHENSIVE METABOLIC PANEL     Status: Abnormal   Collection Time    03/28/2014  7:37 PM      Result Value Ref Range   Sodium 136 (*) 137 - 147 mEq/L   Potassium 4.9  3.7 - 5.3 mEq/L   Chloride 98  96 - 112 mEq/L   CO2 25  19 - 32 mEq/L   Glucose, Bld 193 (*) 70 - 99 mg/dL   BUN 14  6 - 23 mg/dL   Creatinine, Ser 0.95  0.50 - 1.35 mg/dL   Calcium 8.5  8.4 - 10.5 mg/dL   Total Protein 7.1  6.0 - 8.3 g/dL   Albumin 3.7  3.5 - 5.2 g/dL    AST 52 (*) 0 - 37 U/L   ALT 61 (*) 0 - 53 U/L   Alkaline Phosphatase 45  39 - 117 U/L   Total Bilirubin 0.5  0.3 - 1.2 mg/dL   GFR calc non Af Amer 79 (*) >90 mL/min   GFR calc Af Amer >90  >90 mL/min   Comment: (NOTE)     The eGFR has been calculated using the CKD EPI equation.     This calculation has not been validated in all clinical situations.     eGFR's persistently <90 mL/min signify possible Chronic Kidney     Disease.   Anion gap 13  5 - 15  PROTIME-INR     Status: None   Collection Time    03/22/2014  7:37 PM      Result Value Ref Range   Prothrombin Time 13.5  11.6 - 15.2 seconds   INR 1.03  0.00 - 1.49  CBC WITH DIFFERENTIAL     Status: None   Collection Time    04/02/2014  7:37 PM      Result Value Ref Range   WBC 6.2  4.0 - 10.5 K/uL   RBC 4.56  4.22 - 5.81 MIL/uL   Hemoglobin 15.1  13.0 - 17.0 g/dL   HCT 45.3  39.0 - 52.0 %   MCV 99.3  78.0 - 100.0 fL   MCH 33.1  26.0 - 34.0 pg   MCHC 33.3  30.0 - 36.0 g/dL   RDW 13.0  11.5 - 15.5 %   Platelets 187  150 - 400 K/uL   Neutrophils Relative % 67  43 - 77 %   Neutro Abs 4.2  1.7 - 7.7 K/uL   Lymphocytes Relative 19  12 - 46 %   Lymphs Abs 1.2  0.7 - 4.0 K/uL   Monocytes Relative 12  3 - 12 %   Monocytes Absolute 0.7  0.1 - 1.0 K/uL   Eosinophils Relative 1  0 - 5 %   Eosinophils Absolute 0.1  0.0 - 0.7 K/uL   Basophils Relative 1  0 - 1 %   Basophils Absolute 0.0  0.0 - 0.1 K/uL  TROPONIN I     Status: None   Collection Time    03/27/2014  7:37 PM      Result Value Ref Range   Troponin I <0.30  <0.30 ng/mL   Comment:            Due to the release kinetics of cTnI,     a negative result within the first hours     of the onset of symptoms does not rule out     myocardial infarction with certainty.  If myocardial infarction is still suspected,     repeat the test at appropriate intervals.  TYPE AND SCREEN     Status: None   Collection Time    03/15/2014  7:37 PM      Result Value Ref Range   ABO/RH(D) A POS      Antibody Screen NEG     Sample Expiration 03/31/2014    ABO/RH     Status: None   Collection Time    04/10/2014  7:37 PM      Result Value Ref Range   ABO/RH(D) A POS    I-STAT TROPOININ, ED     Status: None   Collection Time    04/08/2014  7:49 PM      Result Value Ref Range   Troponin i, poc 0.01  0.00 - 0.08 ng/mL   Comment 3            Comment: Due to the release kinetics of cTnI,     a negative result within the first hours     of the onset of symptoms does not rule out     myocardial infarction with certainty.     If myocardial infarction is still suspected,     repeat the test at appropriate intervals.  I-STAT CG4 LACTIC ACID, ED     Status: None   Collection Time    03/29/2014  7:51 PM      Result Value Ref Range   Lactic Acid, Venous 1.47  0.5 - 2.2 mmol/L  I-STAT CHEM 8, ED     Status: Abnormal   Collection Time    03/31/2014  7:51 PM      Result Value Ref Range   Sodium 136 (*) 137 - 147 mEq/L   Potassium 4.6  3.7 - 5.3 mEq/L   Chloride 100  96 - 112 mEq/L   BUN 15  6 - 23 mg/dL   Creatinine, Ser 1.00  0.50 - 1.35 mg/dL   Glucose, Bld 200 (*) 70 - 99 mg/dL   Calcium, Ion 1.12 (*) 1.13 - 1.30 mmol/L   TCO2 24  0 - 100 mmol/L   Hemoglobin 17.0  13.0 - 17.0 g/dL   HCT 50.0  39.0 - 52.0 %  URINALYSIS, ROUTINE W REFLEX MICROSCOPIC     Status: Abnormal   Collection Time    04/12/2014  8:03 PM      Result Value Ref Range   Color, Urine YELLOW  YELLOW   APPearance CLEAR  CLEAR   Specific Gravity, Urine 1.017  1.005 - 1.030   pH 6.5  5.0 - 8.0   Glucose, UA 250 (*) NEGATIVE mg/dL   Hgb urine dipstick LARGE (*) NEGATIVE   Bilirubin Urine NEGATIVE  NEGATIVE   Ketones, ur NEGATIVE  NEGATIVE mg/dL   Protein, ur 30 (*) NEGATIVE mg/dL   Urobilinogen, UA 1.0  0.0 - 1.0 mg/dL   Nitrite NEGATIVE  NEGATIVE   Leukocytes, UA NEGATIVE  NEGATIVE  URINE MICROSCOPIC-ADD ON     Status: Abnormal   Collection Time    04/02/2014  8:03 PM      Result Value Ref Range   Squamous  Epithelial / LPF FEW (*) RARE   WBC, UA 0-2  <3 WBC/hpf   RBC / HPF TOO NUMEROUS TO COUNT  <3 RBC/hpf   Bacteria, UA RARE  RARE  I-STAT ARTERIAL BLOOD GAS, ED     Status: Abnormal   Collection Time    04/05/2014  8:21  PM      Result Value Ref Range   pH, Arterial 7.323 (*) 7.350 - 7.450   pCO2 arterial 46.1 (*) 35.0 - 45.0 mmHg   pO2, Arterial 182.0 (*) 80.0 - 100.0 mmHg   Bicarbonate 24.0  20.0 - 24.0 mEq/L   TCO2 25  0 - 100 mmol/L   O2 Saturation 100.0     Acid-base deficit 2.0  0.0 - 2.0 mmol/L   Patient temperature 98.2 F     Collection site RADIAL, ALLEN'S TEST ACCEPTABLE     Drawn by RT     Sample type ARTERIAL      Ct Head Wo Contrast  04/09/2014   CLINICAL DATA:  Altered mental status found down with diaphoresis and weakness. Headache.  EXAM: CT HEAD WITHOUT CONTRAST  CT CERVICAL SPINE WITHOUT CONTRAST  TECHNIQUE: Multidetector CT imaging of the head and cervical spine was performed following the standard protocol without intravenous contrast. Multiplanar CT image reconstructions of the cervical spine were also generated.  COMPARISON:  Head CT 11/09/2009.  FINDINGS: CT HEAD FINDINGS  There is an acute hematoma involving the inferior left cerebellar hemisphere and vermis, measuring 3.6 x 3.4 cm on image 9. There is associated intraventricular hemorrhage involving the fourth, third and lateral ventricles. In addition, there is subarachnoid hemorrhage inferior to the cerebellum, surrounding the cervical spinal cord. A small amount of blood tracks along the inferior surface of the tentorium. Mild hydrocephalus is present.  There is low density throughout the brainstem worrisome for edema. No focal infarct identified. There are no acute supratentorial abnormalities or midline shift.  Patient is intubated. There is ethmoid sinus mucosal thickening. The mastoid air cells are clear. The calvarium is intact.  CT CERVICAL SPINE FINDINGS  The cervical alignment is normal. There is no evidence of  acute fracture or traumatic subluxation. There is multilevel spondylosis with disc space loss, uncinate spurring and facet hypertrophy, most advanced from C4-5 through C6-7.  There is inferior extension of subarachnoid hemorrhage into the cervical spinal canal. No cord compression evident tracheostomy, nasogastric tube and diffuse vascular calcifications noted.  IMPRESSION: 1. Acute hemorrhage within the inferior left cerebellar hemisphere and vermis, likely hypertensive. There is associated intraventricular hemorrhage, subarachnoid hemorrhage and hydrocephalus. 2. Probable diffuse brainstem edema, implying a poor prognosis. 3. Subarachnoid blood extends into the cervical spinal canal. 4. No evidence of acute cervical spine fracture, traumatic subluxation or static signs of instability. Diffuse spondylosis. 5. Critical Value/emergent results were called by telephone at the time of interpretation on 03/16/2014 at 8:37 pm to Dr. Elnora Morrison , who verbally acknowledged these results.   Electronically Signed   By: Camie Patience M.D.   On: 03/19/2014 20:43   Ct Cervical Spine Wo Contrast  03/17/2014   CLINICAL DATA:  Altered mental status found down with diaphoresis and weakness. Headache.  EXAM: CT HEAD WITHOUT CONTRAST  CT CERVICAL SPINE WITHOUT CONTRAST  TECHNIQUE: Multidetector CT imaging of the head and cervical spine was performed following the standard protocol without intravenous contrast. Multiplanar CT image reconstructions of the cervical spine were also generated.  COMPARISON:  Head CT 11/09/2009.  FINDINGS: CT HEAD FINDINGS  There is an acute hematoma involving the inferior left cerebellar hemisphere and vermis, measuring 3.6 x 3.4 cm on image 9. There is associated intraventricular hemorrhage involving the fourth, third and lateral ventricles. In addition, there is subarachnoid hemorrhage inferior to the cerebellum, surrounding the cervical spinal cord. A small amount of blood tracks along the inferior  surface of the tentorium. Mild hydrocephalus is present.  There is low density throughout the brainstem worrisome for edema. No focal infarct identified. There are no acute supratentorial abnormalities or midline shift.  Patient is intubated. There is ethmoid sinus mucosal thickening. The mastoid air cells are clear. The calvarium is intact.  CT CERVICAL SPINE FINDINGS  The cervical alignment is normal. There is no evidence of acute fracture or traumatic subluxation. There is multilevel spondylosis with disc space loss, uncinate spurring and facet hypertrophy, most advanced from C4-5 through C6-7.  There is inferior extension of subarachnoid hemorrhage into the cervical spinal canal. No cord compression evident tracheostomy, nasogastric tube and diffuse vascular calcifications noted.  IMPRESSION: 1. Acute hemorrhage within the inferior left cerebellar hemisphere and vermis, likely hypertensive. There is associated intraventricular hemorrhage, subarachnoid hemorrhage and hydrocephalus. 2. Probable diffuse brainstem edema, implying a poor prognosis. 3. Subarachnoid blood extends into the cervical spinal canal. 4. No evidence of acute cervical spine fracture, traumatic subluxation or static signs of instability. Diffuse spondylosis. 5. Critical Value/emergent results were called by telephone at the time of interpretation on 03/25/2014 at 8:37 pm to Dr. Elnora Morrison , who verbally acknowledged these results.   Electronically Signed   By: Camie Patience M.D.   On: 03/20/2014 20:43   Dg Chest Portable 1 View  03/21/2014   CLINICAL DATA:  Altered mental status.  Unresponsive.  Intubation.  EXAM: PORTABLE CHEST - 1 VIEW  COMPARISON:  11/09/2009  FINDINGS: Endotracheal tube is 4 cm above the carina. NG tube enters the stomach. Patchy airspace disease within the left lung, new since prior study. Cannot exclude infection or aspiration. No confluent opacity on the right. Small left pleural effusion. Mild cardiomegaly.   IMPRESSION: Patchy left lung airspace disease with small left effusion. Cannot exclude infection or aspiration.  Endotracheal tube 4 cm above the carina.   Electronically Signed   By: Rolm Baptise M.D.   On: 04/09/2014 20:21    Review of systems not obtained due to patient factors. Blood pressure 152/77, pulse 67, temperature 98.2 F (36.8 C), temperature source Temporal, resp. rate 18, SpO2 99.00%. Patient is unconscious on the ER stretcher. He exhibits no awakening to noxious stimuli. His pupils are 4 mm on the right and nonreactive and 3 mm on the left and nonreactive. He is weak corneal reflexes. He has a weak gag and cough response. He exhibits some extensor posturing to noxious stimuli. Examination head ears eyes and throat demonstrates no evidence of external trauma. Oropharynx nasopharynx and external auditory canals clear. Chest and abdomen benign. Extremities free from injury or deformity.  Assessment/Plan: Likely hypertensive cerebellar hemorrhage with intraventricular extension and obstructive hydrocephalus. I discussed situation with the patient's family. They're aware of his Graves' condition. I have recommended placement of ventriculostomy in hopes of relieving his obstructive hydrocephalus. I do not think that the intracerebellar clot is large enough to ward surgical evacuation at present. Plan is for-in the ICU on the critical care service. Hypertension should be controlled. Ventriculostomy left in place. If patient makes any neurologic recovery could consider intraventricular tPA over the next few days in hopes of lysing some of his intraventricular hemorrhage. Brett Hartman A 03/13/2014, 9:53 PM

## 2014-04-02 ENCOUNTER — Encounter (HOSPITAL_COMMUNITY): Payer: Self-pay | Admitting: *Deleted

## 2014-04-02 ENCOUNTER — Inpatient Hospital Stay (HOSPITAL_COMMUNITY): Payer: Medicare Other

## 2014-04-02 LAB — GLUCOSE, CAPILLARY
GLUCOSE-CAPILLARY: 110 mg/dL — AB (ref 70–99)
GLUCOSE-CAPILLARY: 122 mg/dL — AB (ref 70–99)
GLUCOSE-CAPILLARY: 136 mg/dL — AB (ref 70–99)
Glucose-Capillary: 111 mg/dL — ABNORMAL HIGH (ref 70–99)
Glucose-Capillary: 148 mg/dL — ABNORMAL HIGH (ref 70–99)

## 2014-04-02 LAB — CBC
HEMATOCRIT: 41.1 % (ref 39.0–52.0)
HEMOGLOBIN: 13.7 g/dL (ref 13.0–17.0)
MCH: 33.3 pg (ref 26.0–34.0)
MCHC: 33.3 g/dL (ref 30.0–36.0)
MCV: 99.8 fL (ref 78.0–100.0)
Platelets: 167 10*3/uL (ref 150–400)
RBC: 4.12 MIL/uL — ABNORMAL LOW (ref 4.22–5.81)
RDW: 12.9 % (ref 11.5–15.5)
WBC: 7.7 10*3/uL (ref 4.0–10.5)

## 2014-04-02 LAB — MRSA PCR SCREENING: MRSA by PCR: NEGATIVE

## 2014-04-02 LAB — BLOOD GAS, ARTERIAL
ACID-BASE DEFICIT: 0.2 mmol/L (ref 0.0–2.0)
BICARBONATE: 22.8 meq/L (ref 20.0–24.0)
Drawn by: 40415
FIO2: 0.4 %
MECHVT: 640 mL
O2 SAT: 95.6 %
PATIENT TEMPERATURE: 96.8
PEEP: 5 cmH2O
PO2 ART: 72.6 mmHg — AB (ref 80.0–100.0)
RATE: 18 resp/min
TCO2: 23.7 mmol/L (ref 0–100)
pCO2 arterial: 28.6 mmHg — ABNORMAL LOW (ref 35.0–45.0)
pH, Arterial: 7.507 — ABNORMAL HIGH (ref 7.350–7.450)

## 2014-04-02 LAB — BASIC METABOLIC PANEL
Anion gap: 14 (ref 5–15)
BUN: 15 mg/dL (ref 6–23)
CO2: 25 meq/L (ref 19–32)
CREATININE: 1.13 mg/dL (ref 0.50–1.35)
Calcium: 7.9 mg/dL — ABNORMAL LOW (ref 8.4–10.5)
Chloride: 98 mEq/L (ref 96–112)
GFR calc Af Amer: 71 mL/min — ABNORMAL LOW (ref >90)
GFR calc non Af Amer: 61 mL/min — ABNORMAL LOW (ref >90)
Glucose, Bld: 122 mg/dL — ABNORMAL HIGH (ref 70–99)
Potassium: 5.1 mEq/L (ref 3.7–5.3)
Sodium: 137 mEq/L (ref 137–147)

## 2014-04-02 MED ORDER — FENTANYL CITRATE 0.05 MG/ML IJ SOLN: 25.0000 ug | INTRAMUSCULAR | Status: DC | PRN

## 2014-04-02 MED ORDER — INSULIN ASPART 100 UNIT/ML ~~LOC~~ SOLN
0.0000 [IU] | Freq: Four times a day (QID) | SUBCUTANEOUS | Status: DC
  Administered 2014-04-02 – 2014-04-03 (×2): 2 [IU] via SUBCUTANEOUS

## 2014-04-02 MED ORDER — INSULIN ASPART 100 UNIT/ML ~~LOC~~ SOLN: 0.0000 [IU] | Freq: Four times a day (QID) | SUBCUTANEOUS | Status: DC

## 2014-04-02 NOTE — Progress Notes (Signed)
UR completed.  Meckenzie Balsley, RN BSN MHA CCM Trauma/Neuro ICU Case Manager [phone]  

## 2014-04-02 NOTE — Progress Notes (Signed)
PULMONARY / CRITICAL CARE MEDICINE   Name: Brett Hartman MRN: [id number] DOB: 1935-12-01    ADMISSION DATE:  04/10/2014  REFERRING MD :  Reather Converse  CHIEF COMPLAINT: Headache  INITIAL PRESENTATION:  78 yo male former smoker developed headache and then unresponsiveness.  Found to have Lt cerebellar and vermis hematoma with IVH, SAH, and mild hydrocephalus.  Intubated for airway protection.  PCCM asked to admit to ICU.  STUDIES:  7/20 CT head >> Lt cerebellar and vermis hematoma with IVH, SAH, and mild hydrocephalus  SIGNIFICANT EVENTS: 7/20 Admit, neurology, neurosurgery consulted. Family requested DNR, no escalation of care 7/21   SUBJECTIVE:  Comatose.    VITAL SIGNS: Temp:  [96.8 F (36 C)-102.4 F (39.1 C)] 102.4 F (39.1 C) (07/21 1400) Pulse Rate:  [56-91] 70 (07/21 1400) Resp:  [13-20] 16 (07/21 1400) BP: (99-172)/(56-89) 147/68 mmHg (07/21 1400) SpO2:  [89 %-100 %] 99 % (07/21 1400) FiO2 (%):  [40 %-100 %] 40 % (07/21 1233) Weight:  [109.4 kg (241 lb 2.9 oz)] 109.4 kg (241 lb 2.9 oz) (07/20 2300) HEMODYNAMICS:   VENTILATOR SETTINGS: Vent Mode:  [-] PRVC FiO2 (%):  [40 %-100 %] 40 % Set Rate:  [18 bmp] 18 bmp Vt Set:  [580 mL-640 mL] 640 mL PEEP:  [5 cmH20] 5 cmH20 Plateau Pressure:  [15 cmH20-19 cmH20] 15 cmH20 INTAKE / OUTPUT: Intake/Output     07/20 0701 - 07/21 0700 07/21 0701 - 07/22 0700   I.V. (mL/kg) 400 (3.7)    Total Intake(mL/kg) 400 (3.7)    Urine (mL/kg/hr) 355 100 (0.1)   Drains 105 58 (0.1)   Total Output 460 158   Net -60 -158          PHYSICAL EXAMINATION: General: RASS -5 Neuro:  Comatose, no spont movement HEENT:  Pupils fixed and mid point, ETT in place Cardiovascular:  Regular, no murmur Lungs: scattered rhonchi Lt > Rt Abdomen:  Soft, non tender Musculoskeletal:  No edema Skin:  No rashes  LABS: I have reviewed all of today's lab results. Relevant abnormalities are discussed in the A/P section  CXR: LLL  atx  ASSESSMENT: SAH with intraventricular extension and hydrocephalus. Acute resp failure due to AMS Hx of OSA. Hx of HTN, RBBB. Hx of BPH. Hx of Barrett's esophagus. LLL infiltrate vs atx, possible aspiration. DM type II with hyperglycemia.   PLAN: Family expresses desire to discontinuation of life sustaining therapies Pt on Stroke service - they will address discontinuation CT head ordered by Dr Erlinda Hong Cont full vent support - settings reviewed and/or adjusted Cont vent bundle No further labs ordered No indication for abx presently Change SSI to q 6 hrs Major priority is comfort Likely withdrawal 7/22   Merton Border, MD ; [facility] service Mobile [phone].  After 5:30 PM or weekends, call [phone]

## 2014-04-02 NOTE — Progress Notes (Signed)
PT Cancellation Note  Patient Details Name: CHANG TIGGS MRN: [id number] DOB: 17-Oct-1935   Cancelled Treatment:    Reason Eval/Treat Not Completed: Patient not medically ready (spoke with nsg, will sign off, patient not appropriate for PT, if medical situation improves please reconsult)   Duncan Dull 04/02/2014, 9:27 AM Alben Deeds, PT DPT  [phone]

## 2014-04-02 NOTE — Progress Notes (Signed)
No significant events overnight. Patient remains intubated on ventilator.  Afebrile. Vitals are stable. Ventriculostomy output moderate. Still blood-tinged. Drain working well. Patient without any evidence of improvement overnight. Still no eye opening to noxious stimuli. Pupils remained nonreactive with right pupil somewhat larger than left. Weak corneals. Cough and gag still present. Extends extremities to noxious stimuli.  Devastating posterior fossa hemorrhage with intraventricular extension and obstructive hydrocephalus. Continue efforts at supportive care for now. Continue the ventriculostomy. Re\re CT scan in morning.

## 2014-04-02 NOTE — Progress Notes (Signed)
Had long discussion with Son, who states his father would NOT want any additional heroic measures. He verbalizes that in the event of acute decompensation there should be no ACLS performed. They are also opposed to vasoactive medications. Will change code status to DNR at this time.   Georgann Housekeeper, ACNP [facility] Pulmonology/Critical Care Pager [phone] or [phone]

## 2014-04-02 NOTE — Progress Notes (Signed)
Stroke Team Progress Note   Admit Date: 03/31/2014  LOS: 1 day   HISTORY 78 year old male with history of multiple medical problems including hypertension, DM and OSA presented with progressive headaches and lost consciousness. He was intubated in the field. Transferred to [facility] emergency department where he continued to decline neurologically. Upon arrival emergency department patient would not open his eyes to noxious stimuli. He exhibited some minimal extensor posturing. No movements to command. Head CT scan demonstrated evidence of a large posterior fossa hemorrhage beginning in his left inferior cerebellar hemisphere and rupturing into his fourth ventricle. There is a large amount of intraventricular blood with secondary obstructive hydrocephalus. The patient is on aspirin at home. He has no other known anticoagulant use. He was admitted to the neuro ICU for further evaluation and treatment.  SUBJECTIVE No acute event overnight. He remained to be intubated with minimal brainstem function. Some family members is coming from out of state and they may consider withdraw care once arrive. NSG on board and had EVD placed.   OBJECTIVE Most recent Vital Signs: Filed Vitals:   04/02/14 1100 04/02/14 1130 04/02/14 1200 04/02/14 1300  BP: 150/70 142/73 139/71 155/68  Pulse: 66 66 67 66  Temp: 101.7 F (38.7 C) 101.8 F (38.8 C) 102 F (38.9 C) 102.2 F (39 C)  TempSrc:      Resp: 18 18 18 18   Height:      Weight:      SpO2: 100% 100% 100% 100%   CBG (last 3)   Recent Labs  04/02/14 0316 04/02/14 0836 04/02/14 1230  GLUCAP 122* 111* 110*     IV Fluid Intake:   . sodium chloride 50 mL/hr at 04/02/14 0700    Medications: Scheduled:  .  stroke: mapping our early stages of recovery book   Does not apply Once  . sodium chloride   Intravenous Once  . antiseptic oral rinse  15 mL Mouth Rinse QID  . chlorhexidine  15 mL Mouth Rinse BID  . insulin aspart  0-15 Units Subcutaneous Q6H   . pantoprazole (PROTONIX) IV  40 mg Intravenous QHS  . senna-docusate  1 tablet Oral BID   Continuous Infusions:  . sodium chloride 50 mL/hr at 04/02/14 0700   PRN: @MEDSPRNSH @  Diet:    NPO Activity:  Bedrest DVT Prophylaxis:  SCDs  CLINICALLY SIGNIFICANT STUDIES Basic Metabolic Panel:  Recent Labs Lab 03/29/2014 1937 04/08/2014 1951 04/02/14 0255  NA 136* 136* 137  K 4.9 4.6 5.1  CL 98 100 98  CO2 25  --  25  GLUCOSE 193* 200* 122*  BUN 14 15 15   CREATININE 0.95 1.00 1.13  CALCIUM 8.5  --  7.9*   Liver Function Tests:  Recent Labs Lab 04/09/2014 1937  AST 52*  ALT 61*  ALKPHOS 45  BILITOT 0.5  PROT 7.1  ALBUMIN 3.7   CBC:  Recent Labs Lab 03/22/2014 1937 04/09/2014 1951 04/02/14 0255  WBC 6.2  --  7.7  NEUTROABS 4.2  --   --   HGB 15.1 17.0 13.7  HCT 45.3 50.0 41.1  MCV 99.3  --  99.8  PLT 187  --  167   Coagulation:  Recent Labs Lab 03/21/2014 1937  LABPROT 13.5  INR 1.03   Cardiac Enzymes:  Recent Labs Lab 03/13/2014 1937  TROPONINI <0.30   Urinalysis:  Recent Labs Lab 03/29/2014 2003  COLORURINE YELLOW  LABSPEC 1.017  PHURINE 6.5  GLUCOSEU 250*  HGBUR LARGE*  BILIRUBINUR NEGATIVE  KETONESUR NEGATIVE  PROTEINUR 30*  UROBILINOGEN 1.0  NITRITE NEGATIVE  LEUKOCYTESUR NEGATIVE   Lipid Panel    Component Value Date/Time   TRIG 104 10/17/2013 1119   HDL 43 10/17/2013 1119   CHOLHDL 3.4 10/17/2013 1119   LDLCALC 83 10/17/2013 1119   HgbA1C .resu Lab Results  Component Value Date   HGBA1C 6.1* 05/08/2013   Urine Drug Screen:   No results found for this basename: labopia, cocainscrnur, labbenz, amphetmu, thcu, labbarb    Alcohol Level: No results found for this basename: ETH,  in the last 168 hours  Ct Head Wo Contrast  03/15/2014   CLINICAL DATA:  Altered mental status found down with diaphoresis and weakness. Headache.  EXAM: CT HEAD WITHOUT CONTRAST  CT CERVICAL SPINE WITHOUT CONTRAST  TECHNIQUE: Multidetector CT imaging of the head and  cervical spine was performed following the standard protocol without intravenous contrast. Multiplanar CT image reconstructions of the cervical spine were also generated.  COMPARISON:  Head CT 11/09/2009.  FINDINGS: CT HEAD FINDINGS  There is an acute hematoma involving the inferior left cerebellar hemisphere and vermis, measuring 3.6 x 3.4 cm on image 9. There is associated intraventricular hemorrhage involving the fourth, third and lateral ventricles. In addition, there is subarachnoid hemorrhage inferior to the cerebellum, surrounding the cervical spinal cord. A small amount of blood tracks along the inferior surface of the tentorium. Mild hydrocephalus is present.  There is low density throughout the brainstem worrisome for edema. No focal infarct identified. There are no acute supratentorial abnormalities or midline shift.  Patient is intubated. There is ethmoid sinus mucosal thickening. The mastoid air cells are clear. The calvarium is intact.  CT CERVICAL SPINE FINDINGS  The cervical alignment is normal. There is no evidence of acute fracture or traumatic subluxation. There is multilevel spondylosis with disc space loss, uncinate spurring and facet hypertrophy, most advanced from C4-5 through C6-7.  There is inferior extension of subarachnoid hemorrhage into the cervical spinal canal. No cord compression evident tracheostomy, nasogastric tube and diffuse vascular calcifications noted.  IMPRESSION: 1. Acute hemorrhage within the inferior left cerebellar hemisphere and vermis, likely hypertensive. There is associated intraventricular hemorrhage, subarachnoid hemorrhage and hydrocephalus. 2. Probable diffuse brainstem edema, implying a poor prognosis. 3. Subarachnoid blood extends into the cervical spinal canal. 4. No evidence of acute cervical spine fracture, traumatic subluxation or static signs of instability. Diffuse spondylosis. 5. Critical Value/emergent results were called by telephone at the time of  interpretation on 04/02/2014 at 8:37 pm to Dr. Elnora Morrison , who verbally acknowledged these results.   Electronically Signed   By: Camie Patience M.D.   On: 03/16/2014 20:43   Ct Cervical Spine Wo Contrast  04/09/2014   CLINICAL DATA:  Altered mental status found down with diaphoresis and weakness. Headache.  EXAM: CT HEAD WITHOUT CONTRAST  CT CERVICAL SPINE WITHOUT CONTRAST  TECHNIQUE: Multidetector CT imaging of the head and cervical spine was performed following the standard protocol without intravenous contrast. Multiplanar CT image reconstructions of the cervical spine were also generated.  COMPARISON:  Head CT 11/09/2009.  FINDINGS: CT HEAD FINDINGS  There is an acute hematoma involving the inferior left cerebellar hemisphere and vermis, measuring 3.6 x 3.4 cm on image 9. There is associated intraventricular hemorrhage involving the fourth, third and lateral ventricles. In addition, there is subarachnoid hemorrhage inferior to the cerebellum, surrounding the cervical spinal cord. A small amount of blood tracks along the inferior surface of the tentorium. Mild hydrocephalus is present.  There is low density throughout the brainstem worrisome for edema. No focal infarct identified. There are no acute supratentorial abnormalities or midline shift.  Patient is intubated. There is ethmoid sinus mucosal thickening. The mastoid air cells are clear. The calvarium is intact.  CT CERVICAL SPINE FINDINGS  The cervical alignment is normal. There is no evidence of acute fracture or traumatic subluxation. There is multilevel spondylosis with disc space loss, uncinate spurring and facet hypertrophy, most advanced from C4-5 through C6-7.  There is inferior extension of subarachnoid hemorrhage into the cervical spinal canal. No cord compression evident tracheostomy, nasogastric tube and diffuse vascular calcifications noted.  IMPRESSION: 1. Acute hemorrhage within the inferior left cerebellar hemisphere and vermis, likely  hypertensive. There is associated intraventricular hemorrhage, subarachnoid hemorrhage and hydrocephalus. 2. Probable diffuse brainstem edema, implying a poor prognosis. 3. Subarachnoid blood extends into the cervical spinal canal. 4. No evidence of acute cervical spine fracture, traumatic subluxation or static signs of instability. Diffuse spondylosis. 5. Critical Value/emergent results were called by telephone at the time of interpretation on 04/11/2014 at 8:37 pm to Dr. Elnora Morrison , who verbally acknowledged these results.   Electronically Signed   By: Camie Patience M.D.   On: 03/26/2014 20:43   Dg Chest Port 1 View  04/02/2014   CLINICAL DATA:  Re-evaluate airspace disease.  EXAM: PORTABLE CHEST - 1 VIEW  COMPARISON:  Portable chest x-ray of April 01, 2014  FINDINGS: The lungs are less well inflated on the current study. The right lung is clear. On the left the hemidiaphragm remains obscured. The retrocardiac region remains dense. The cardiac silhouette is mildly enlarged. The pulmonary vascularity is normal.  The endotracheal tube tip lies approximately 3.4 cm above the crotch of the carina. The esophagogastric tube tip projects off the film.  IMPRESSION: Persistent abnormality at the left lung base consistent with atelectasis and/or pneumonia. A small amount of pleural fluid may be present.   Electronically Signed   By: David  Martinique   On: 04/02/2014 07:43   Dg Chest Portable 1 View  03/26/2014   CLINICAL DATA:  Altered mental status.  Unresponsive.  Intubation.  EXAM: PORTABLE CHEST - 1 VIEW  COMPARISON:  11/09/2009  FINDINGS: Endotracheal tube is 4 cm above the carina. NG tube enters the stomach. Patchy airspace disease within the left lung, new since prior study. Cannot exclude infection or aspiration. No confluent opacity on the right. Small left pleural effusion. Mild cardiomegaly.  IMPRESSION: Patchy left lung airspace disease with small left effusion. Cannot exclude infection or aspiration.   Endotracheal tube 4 cm above the carina.   Electronically Signed   By: Rolm Baptise M.D.   On: 03/22/2014 20:21   Physical Exam Temp:  [96.8 F (36 C)-102.2 F (39 C)] 102.2 F (39 C) (07/21 1300) Pulse Rate:  [56-91] 66 (07/21 1300) Resp:  [13-20] 18 (07/21 1300) BP: (99-172)/(56-89) 155/68 mmHg (07/21 1300) SpO2:  [89 %-100 %] 100 % (07/21 1300) FiO2 (%):  [40 %-100 %] 40 % (07/21 1233) Weight:  [241 lb 2.9 oz (109.4 kg)] 241 lb 2.9 oz (109.4 kg) (07/20 2300)  General - Well nourished, well developed, intubated and in coma.  Ophthalmologic - not able to see through.  Cardiovascular - Regular rate and rhythm with no murmur.  Lung - clear to auscultation bilaterally   Neuro - intubated, not on sedation. Eye closed, pupil 3mm on the right and 64mm on the left, right pupil minimally reactive and left pupil not  reactive to light. No corneal, but positive gag and cough, not breathing over the vent on RR 18, painful stimulation no movement UEs but trace withdraw LEs, b/l babinski positive. DTR diminished.  ASSESSMENT Mr. ARMON ORVIS is a 78 y.o. male presenting with cerebellar bleeding extended to IV, III and lateral ventricals with SAH and hydrocephalus. NSG on board, had EVD done. Pt had only minimum brainstem reflex, intubated. Very poor neurological prognosis. Will continue current treatment. Family is coming from out of state to make decisions regarding further treatment plan.    HTN  DM  TREATMENT/PLAN  Continue life support with ventilation and monitoring.  BP control with goal < 160  DVT and GI prophylaxis   Grave prognosis and family aware and will not escalate care as per family wishes  Discuss with family more when they arrive from out of state regarding further plan.   SIGNED Rosalin Hawking, MD PhD 04/02/2014 2:53 PM      To contact Stroke Continuity provider, please refer to [email or web address] After hours, contact General Neurology

## 2014-04-03 ENCOUNTER — Inpatient Hospital Stay (HOSPITAL_COMMUNITY): Payer: Medicare Other

## 2014-04-03 ENCOUNTER — Encounter (HOSPITAL_COMMUNITY): Payer: Self-pay

## 2014-04-03 LAB — GLUCOSE, CAPILLARY
GLUCOSE-CAPILLARY: 115 mg/dL — AB (ref 70–99)
Glucose-Capillary: 131 mg/dL — ABNORMAL HIGH (ref 70–99)
Glucose-Capillary: 136 mg/dL — ABNORMAL HIGH (ref 70–99)

## 2014-04-03 MED ORDER — MORPHINE SULFATE 10 MG/ML IJ SOLN
1.0000 mg/h | INTRAVENOUS | Status: DC
  Administered 2014-04-03: 1 mg/h via INTRAVENOUS
  Filled 2014-04-03 (×2): qty 10

## 2014-04-03 MED ORDER — MORPHINE BOLUS VIA INFUSION
5.0000 mg | INTRAVENOUS | Status: DC | PRN
  Filled 2014-04-03: qty 20

## 2014-04-13 NOTE — Clinical Documentation Improvement (Signed)
Clinical Documentation Clarification #1:   Abnormal diagnostic findings (MRI scans, CT scans, etc.) are not coded and reported unless the physician indicates their clinical significance. If possible, please help by clarifying the diagnostic and/or clinical significance of the abnormal diagnostic study for this patient.    Cerebral Edema   Cytotoxic Edema   Subfalcine Herniation   Uncal/Transtentorial Herniation   Vasogenic Edema   Other brain herniation (please specify)    Other cause (please specify)    Unable to determine   Unknown   Diagnostics: 7/20: CT head: there is low density throughout the brainstem worrisome for edema, probable diffuse brainstem edema. 7/20: CT head: low density presumed vasogenic edema vs retrograde flow cerebral spinal fluid.  ________________________________________________________________________________________________ Clinical Documentation Clarification #2:   Possible Diagnosis?   >Aspiration Pneumonia >Community Acquired Pneumonia >Atelectasis >Other Diagnosis >Cannot Clinically Determine   Risk Factors: LLL infiltrate vs ATX, possible aspiration, per 7/21 progress notes.  7/20: CXR: Abnormality at the left lung base c/w atelectasis and /or pneumonia.  7/20: CXR Patchy left lung airspace disease with small left effusion; cannot exclude infection or aspiration.   Thank you,  Theron Arista, Clinical Documentation Specialist:  [facility] [employer]

## 2014-04-13 NOTE — Progress Notes (Signed)
No significant change in clinical status. Patient has made no awakening.  Followup head CT scan continues to demonstrate massive intervention or hemorrhage worse in his third and fourth ventricles with significant compression upon his hypothalamus midbrain and pons.  I discussed situation further with the family. They wished no further interventions and are looking towards withdrawal of care later today when additional family arrives.

## 2014-04-13 NOTE — Progress Notes (Signed)
PULMONARY / CRITICAL CARE MEDICINE   Name: Brett Hartman MRN: [id number] DOB: 1936-02-08    ADMISSION DATE:  04/02/2014  REFERRING MD :  Reather Converse  CHIEF COMPLAINT: Headache  INITIAL PRESENTATION:  78 yo male former smoker developed headache and then unresponsiveness.  Found to have Lt cerebellar and vermis hematoma with IVH, SAH, and mild hydrocephalus.  Intubated for airway protection.  PCCM asked to admit to ICU.  STUDIES:  7/20 CT head >> Lt cerebellar and vermis hematoma with IVH, SAH, and mild hydrocephalus 7/21 head CT - increasing intraventricular hemorrhage and worsening hydrocephalus, Basal  cistern effacement with cerebellar herniation, evolving left cerebellum intraparenchymal hematoma.   SIGNIFICANT EVENTS: 7/20 Admit, neurology, neurosurgery consulted. Family requested DNR, no escalation of care 7/20 right frontal ventriculostomy    SUBJECTIVE:  Remains Comatose.  Low gr fever   VITAL SIGNS: Temp:  [100 F (37.8 C)-102.4 F (39.1 C)] 100.6 F (38.1 C) (07/22 0727) Pulse Rate:  [59-72] 69 (07/22 0815) Resp:  [16-18] 18 (07/22 0815) BP: (125-159)/(65-79) 159/75 mmHg (07/22 0815) SpO2:  [96 %-100 %] 100 % (07/22 0815) FiO2 (%):  [40 %] 40 % (07/22 0815) HEMODYNAMICS:   VENTILATOR SETTINGS: Vent Mode:  [-] PRVC FiO2 (%):  [40 %] 40 % Set Rate:  [18 bmp] 18 bmp Vt Set:  [640 mL] 640 mL PEEP:  [5 cmH20] 5 cmH20 Plateau Pressure:  [15 cmH20-18 cmH20] 18 cmH20 INTAKE / OUTPUT: Intake/Output     07/21 0701 - 07/22 0700 07/22 0701 - 07/23 0700   I.V. (mL/kg) 600 (5.5) 50 (0.5)   Total Intake(mL/kg) 600 (5.5) 50 (0.5)   Urine (mL/kg/hr) 645 (0.2) 25 (0.1)   Emesis/NG output 700 (0.3)    Drains 223 (0.1) 11 (0.1)   Total Output 1568 36   Net -968 +14          PHYSICAL EXAMINATION: General: RASS -3 Neuro:  Comatose, no spont movement, plantar right upgoing HEENT:  Pupils fixed and mid point, ETT in place Cardiovascular:  Regular, no murmur Lungs:  scattered rhonchi Lt > Rt Abdomen:  Soft, non tender Musculoskeletal:  No edema Skin:  No rashes  LABS: I have reviewed all of today's lab results. Relevant abnormalities are discussed in the A/P section  CXR: LLL atx  ASSESSMENT: SAH with intraventricular extension and hydrocephalus. Acute resp failure due to AMS Hx of OSA. Hx of HTN, RBBB. Hx of BPH. Hx of Barrett's esophagus. LLL infiltrate vs atx, possible aspiration. DM type II with hyperglycemia.   PLAN: Family has expressed desire to discontinue of life sustaining therapies Cont full vent support - settings reviewed and/or adjusted Cont vent bundle Change SSI to q 6 hrs Major priority is comfort Likely withdrawal 7/22    Kara Mead MD. FCCP. [facility] Pager [phone] If no response call 319 [phone]

## 2014-04-13 NOTE — Progress Notes (Addendum)
RT guidelines started for withdrawal of life. RN and RT at bedside. Pt seems comfortable

## 2014-04-13 NOTE — Progress Notes (Signed)
RT transported patient to CT and back to 3M11 on vent with no complications.

## 2014-04-13 NOTE — Progress Notes (Signed)
Nutrition Brief Note  Chart reviewed. Pt now transitioning to comfort care.  No further nutrition interventions warranted at this time.  Please re-consult as needed.   Latriece Anstine RD, LDN, CNSC [phone] Pager [phone] After Hours Pager    

## 2014-04-13 NOTE — Progress Notes (Signed)
Stroke Team Progress Note   Admit Date: 03/15/2014  LOS: 2 days   HISTORY 78 year old male with history of multiple medical problems including hypertension, DM and OSA presented with progressive headaches and lost consciousness. He was intubated in the field. Transferred to [facility] emergency department where he continued to decline neurologically. Upon arrival emergency department patient would not open his eyes to noxious stimuli. He exhibited some minimal extensor posturing. No movements to command. Head CT scan demonstrated evidence of a large posterior fossa hemorrhage beginning in his left inferior cerebellar hemisphere and rupturing into his fourth ventricle. There is a large amount of intraventricular blood with secondary obstructive hydrocephalus. The patient is on aspirin at home. He has no other known anticoagulant use. He was admitted to the neuro ICU for further evaluation and treatment.  SUBJECTIVE No acute event overnight. He remained to be intubated with minimal brainstem function. Repeat CT head showed worsening bleeding during the interval time. Son and daughter in law at bedside. Had long discussion with them and they are willing to withdraw care once family arrives.   OBJECTIVE Most recent Vital Signs: Filed Vitals:   2014-04-21 1229 04/21/14 1300 2014-04-21 1500 2014-04-21 1515  BP: 156/83 168/75    Pulse: 72 87 87   Temp: 100.6 F (38.1 C) 100.6 F (38.1 C) 100.7 F (38.2 C)   TempSrc:      Resp:  17 12   Height:      Weight:    241 lb 2.9 oz (109.4 kg)  SpO2: 98% 99% 47%    CBG (last 3)   Recent Labs  April 21, 2014 0041 04/21/14 0544 Apr 21, 2014 1149  GLUCAP 131* 115* 136*     IV Fluid Intake:   . sodium chloride 50 mL/hr at 2014-04-21 0700  . morphine Stopped (04/21/14 1636)    Medications: Scheduled:  .  stroke: mapping our early stages of recovery book   Does not apply Once  . sodium chloride   Intravenous Once   Continuous Infusions:  . sodium chloride 50 mL/hr  at 04/21/14 0700  . morphine Stopped (04-21-14 1636)   PRN: @MEDSPRNSH @  Diet:    NPO Activity:  Bedrest DVT Prophylaxis:  SCDs  CLINICALLY SIGNIFICANT STUDIES Basic Metabolic Panel:   Recent Labs Lab 04/08/2014 1937 04/02/2014 1951 04/02/14 0255  NA 136* 136* 137  K 4.9 4.6 5.1  CL 98 100 98  CO2 25  --  25  GLUCOSE 193* 200* 122*  BUN 14 15 15   CREATININE 0.95 1.00 1.13  CALCIUM 8.5  --  7.9*   Liver Function Tests:   Recent Labs Lab 03/22/2014 1937  AST 52*  ALT 61*  ALKPHOS 45  BILITOT 0.5  PROT 7.1  ALBUMIN 3.7   CBC:   Recent Labs Lab 03/26/2014 1937 04/02/2014 1951 04/02/14 0255  WBC 6.2  --  7.7  NEUTROABS 4.2  --   --   HGB 15.1 17.0 13.7  HCT 45.3 50.0 41.1  MCV 99.3  --  99.8  PLT 187  --  167   Coagulation:   Recent Labs Lab 03/18/2014 1937  LABPROT 13.5  INR 1.03   Cardiac Enzymes:   Recent Labs Lab 03/29/2014 1937  TROPONINI <0.30   Urinalysis:   Recent Labs Lab 03/27/2014 2003  COLORURINE YELLOW  LABSPEC 1.017  PHURINE 6.5  GLUCOSEU 250*  HGBUR LARGE*  BILIRUBINUR NEGATIVE  KETONESUR NEGATIVE  PROTEINUR 30*  UROBILINOGEN 1.0  NITRITE NEGATIVE  LEUKOCYTESUR NEGATIVE   Lipid Panel  Component Value Date/Time   TRIG 104 10/17/2013 1119   HDL 43 10/17/2013 1119   CHOLHDL 3.4 10/17/2013 1119   LDLCALC 83 10/17/2013 1119   HgbA1C .resu Lab Results  Component Value Date   HGBA1C 6.1* 05/08/2013   Urine Drug Screen:   No results found for this basename: labopia,  cocainscrnur,  labbenz,  amphetmu,  thcu,  labbarb    Alcohol Level: No results found for this basename: ETH,  in the last 168 hours  Ct Head Wo Contrast  2014/04/16   CLINICAL DATA:  Intraventricular hemorrhage.  EXAM: CT HEAD WITHOUT CONTRAST  TECHNIQUE: Contiguous axial images were obtained from the base of the skull through the vertex without intravenous contrast.  COMPARISON:  CT of the head April 01, 2014  FINDINGS: Diffuse intraventricular hemorrhage, with worsening  ventriculomegaly, anterior recess of the third ventricle is 22 mm, previously 14 mm. Increasing amount of layering blood products within the occipital horn. Status post right frontal ventriculostomy placement, with distal tip terminating in the right frontal horn. Low-density presumed vasogenic edema versus retrograde flow cerebral spinal fluid extends along the catheter tract. Worsening periventricular low-density most consistent with transependymal flow cerebral spinal fluid. Again seen is left cerebellar intraparenchymal hemorrhage with intraventricular extension. Upward and downward cerebellar herniation with basal cistern effacement.  Small amount of subarachnoid blood products within the right parietal sulci, bilateral mesial occipital sulci. No midline shift. No acute large vascular territory infarct.  Severe calcific atherosclerosis of the carotid siphons. Patient is intubated. Mild paranasal sinus mucosal thickening. Status post left scleral banding with dense calcification versus radiopaque foreign body within the medial left orbit. Mastoid air cells are well aerated. No skull fracture.  IMPRESSION: Interval placement of high right frontal ventriculostomy catheter with distal tip in right frontal ventricle, increasing intraventricular hemorrhage and worsening hydrocephalus/transependymal flow cerebral spinal fluid. Basal cistern effacement with cerebellar herniation, evolving left cerebellum intraparenchymal hematoma.  New small amount of parietal occipital subarachnoid blood.  Findings discussed with and reconfirmed by Dr.STERN onAug 04, 2015at1:50 am.   Electronically Signed   By: Elon Alas   On: 2014/04/16 01:53   Ct Head Wo Contrast  04/02/2014   CLINICAL DATA:  Altered mental status found down with diaphoresis and weakness. Headache.  EXAM: CT HEAD WITHOUT CONTRAST  CT CERVICAL SPINE WITHOUT CONTRAST  TECHNIQUE: Multidetector CT imaging of the head and cervical spine was performed following  the standard protocol without intravenous contrast. Multiplanar CT image reconstructions of the cervical spine were also generated.  COMPARISON:  Head CT 11/09/2009.  FINDINGS: CT HEAD FINDINGS  There is an acute hematoma involving the inferior left cerebellar hemisphere and vermis, measuring 3.6 x 3.4 cm on image 9. There is associated intraventricular hemorrhage involving the fourth, third and lateral ventricles. In addition, there is subarachnoid hemorrhage inferior to the cerebellum, surrounding the cervical spinal cord. A small amount of blood tracks along the inferior surface of the tentorium. Mild hydrocephalus is present.  There is low density throughout the brainstem worrisome for edema. No focal infarct identified. There are no acute supratentorial abnormalities or midline shift.  Patient is intubated. There is ethmoid sinus mucosal thickening. The mastoid air cells are clear. The calvarium is intact.  CT CERVICAL SPINE FINDINGS  The cervical alignment is normal. There is no evidence of acute fracture or traumatic subluxation. There is multilevel spondylosis with disc space loss, uncinate spurring and facet hypertrophy, most advanced from C4-5 through C6-7.  There is inferior extension of subarachnoid hemorrhage into  the cervical spinal canal. No cord compression evident tracheostomy, nasogastric tube and diffuse vascular calcifications noted.  IMPRESSION: 1. Acute hemorrhage within the inferior left cerebellar hemisphere and vermis, likely hypertensive. There is associated intraventricular hemorrhage, subarachnoid hemorrhage and hydrocephalus. 2. Probable diffuse brainstem edema, implying a poor prognosis. 3. Subarachnoid blood extends into the cervical spinal canal. 4. No evidence of acute cervical spine fracture, traumatic subluxation or static signs of instability. Diffuse spondylosis. 5. Critical Value/emergent results were called by telephone at the time of interpretation on 04/11/2014 at 8:37 pm to  Dr. Elnora Morrison , who verbally acknowledged these results.   Electronically Signed   By: Camie Patience M.D.   On: 04/12/2014 20:43   Ct Cervical Spine Wo Contrast  03/19/2014   CLINICAL DATA:  Altered mental status found down with diaphoresis and weakness. Headache.  EXAM: CT HEAD WITHOUT CONTRAST  CT CERVICAL SPINE WITHOUT CONTRAST  TECHNIQUE: Multidetector CT imaging of the head and cervical spine was performed following the standard protocol without intravenous contrast. Multiplanar CT image reconstructions of the cervical spine were also generated.  COMPARISON:  Head CT 11/09/2009.  FINDINGS: CT HEAD FINDINGS  There is an acute hematoma involving the inferior left cerebellar hemisphere and vermis, measuring 3.6 x 3.4 cm on image 9. There is associated intraventricular hemorrhage involving the fourth, third and lateral ventricles. In addition, there is subarachnoid hemorrhage inferior to the cerebellum, surrounding the cervical spinal cord. A small amount of blood tracks along the inferior surface of the tentorium. Mild hydrocephalus is present.  There is low density throughout the brainstem worrisome for edema. No focal infarct identified. There are no acute supratentorial abnormalities or midline shift.  Patient is intubated. There is ethmoid sinus mucosal thickening. The mastoid air cells are clear. The calvarium is intact.  CT CERVICAL SPINE FINDINGS  The cervical alignment is normal. There is no evidence of acute fracture or traumatic subluxation. There is multilevel spondylosis with disc space loss, uncinate spurring and facet hypertrophy, most advanced from C4-5 through C6-7.  There is inferior extension of subarachnoid hemorrhage into the cervical spinal canal. No cord compression evident tracheostomy, nasogastric tube and diffuse vascular calcifications noted.  IMPRESSION: 1. Acute hemorrhage within the inferior left cerebellar hemisphere and vermis, likely hypertensive. There is associated  intraventricular hemorrhage, subarachnoid hemorrhage and hydrocephalus. 2. Probable diffuse brainstem edema, implying a poor prognosis. 3. Subarachnoid blood extends into the cervical spinal canal. 4. No evidence of acute cervical spine fracture, traumatic subluxation or static signs of instability. Diffuse spondylosis. 5. Critical Value/emergent results were called by telephone at the time of interpretation on 03/28/2014 at 8:37 pm to Dr. Elnora Morrison , who verbally acknowledged these results.   Electronically Signed   By: Camie Patience M.D.   On: 03/30/2014 20:43   Dg Chest Port 1 View  04/02/2014   CLINICAL DATA:  Re-evaluate airspace disease.  EXAM: PORTABLE CHEST - 1 VIEW  COMPARISON:  Portable chest x-ray of April 01, 2014  FINDINGS: The lungs are less well inflated on the current study. The right lung is clear. On the left the hemidiaphragm remains obscured. The retrocardiac region remains dense. The cardiac silhouette is mildly enlarged. The pulmonary vascularity is normal.  The endotracheal tube tip lies approximately 3.4 cm above the crotch of the carina. The esophagogastric tube tip projects off the film.  IMPRESSION: Persistent abnormality at the left lung base consistent with atelectasis and/or pneumonia. A small amount of pleural fluid may be present.   Electronically  Signed   By: David  Martinique   On: 04/02/2014 07:43   Dg Chest Portable 1 View  03/20/2014   CLINICAL DATA:  Altered mental status.  Unresponsive.  Intubation.  EXAM: PORTABLE CHEST - 1 VIEW  COMPARISON:  11/09/2009  FINDINGS: Endotracheal tube is 4 cm above the carina. NG tube enters the stomach. Patchy airspace disease within the left lung, new since prior study. Cannot exclude infection or aspiration. No confluent opacity on the right. Small left pleural effusion. Mild cardiomegaly.  IMPRESSION: Patchy left lung airspace disease with small left effusion. Cannot exclude infection or aspiration.  Endotracheal tube 4 cm above the  carina.   Electronically Signed   By: Rolm Baptise M.D.   On: 03/14/2014 20:21   Physical Exam Temp:  [100 F (37.8 C)-101.8 F (38.8 C)] 100.7 F (38.2 C) (07/22 1500) Pulse Rate:  [59-87] 87 (07/22 1500) Resp:  [12-18] 12 (07/22 1500) BP: (139-168)/(69-83) 168/75 mmHg (07/22 1300) SpO2:  [47 %-100 %] 47 % (07/22 1500) FiO2 (%):  [30 %-40 %] 30 % (07/22 1430) Weight:  [241 lb 2.9 oz (109.4 kg)] 241 lb 2.9 oz (109.4 kg) (07/22 1515)  General - Well nourished, well developed, intubated and in coma.  Ophthalmologic - not able to see through.  Cardiovascular - Regular rate and rhythm with no murmur.  Lung - clear to auscultation bilaterally   Neuro - intubated, not on sedation. Eye closed, pupil 37mm on the right and 12mm on the left, right pupil minimally reactive and left pupil not reactive to light. No corneal, but positive gag and cough, not breathing over the vent on RR 18, painful stimulation no movement UEs but trace withdraw LEs, b/l babinski positive. DTR diminished.  ASSESSMENT Mr. FED CECI is a 78 y.o. male presenting with cerebellar bleeding extended to IV, III and lateral ventricals with SAH and hydrocephalus. NSG on board, had EVD done. Pt had only minimum brainstem reflex, intubated. Very poor neurological prognosis. Will continue current treatment. Had long discussion with son and daughter in law about diagnosis, treatment options and prognosis, showed them images and they are willing to proceed with withdraw care once families arrive.    HTN  DM  TREATMENT/PLAN  Continue life support with ventilation and monitoring until family arrives and ready for withdraw care.  BP control with goal < 160  DVT and GI prophylaxis   Grave prognosis and family aware and will not escalate care as per family wishes  This patient is critically ill due to cerebellar hemorrhage, brain herniation, hydrocephalus and SAH, and at significant risk of neurological worsening, death form  brain herniation, cerebral edema, hydrocephalus. This patient's care requires constant monitoring of vital signs, hemodynamics, respiratory and cardiac monitoring, review of multiple databases, neurological assessment, discussion with family, other specialists and medical decision making of high complexity. I spent 40 minutes of neurocritical care time in the care of this patient.  SIGNED Rosalin Hawking, MD PhD 2014-04-19 6:33 PM      To contact Stroke Continuity provider, please refer to [email or web address] After hours, contact General Neurology

## 2014-04-13 NOTE — Progress Notes (Signed)
Withdrawal of care performed per MD order and family request.  Family at bedside throughout process.  Pt's time of death at 1515.  Heart sounds auscultated for 5 minutes without sound by Gabriel Cirri RN and Gilford Rile, RN.  Attending physician notified of time of death.  CCM also notified.  [facility] referral completed.  25 ml of morphine 1 mg/ml wasted in sink with Gilford Rile, RN.  Emotional support provided to family.  Takhia Spoon, [facility]

## 2014-04-13 NOTE — Progress Notes (Signed)
Chaplain responded to spiritual care consult, providing emotional and grief support and prayer  to pt's friends, Holland Commons and Katharine Look. They and the pt and longtime members of a local [employer]. His minister has visited daily, including this morning, and will be present again this afternoon. They are aware of chaplain services and availability, but appear to have strong pastoral support. Please page for follow up if needed.   Ethelene Browns [phone]

## 2014-04-13 NOTE — Progress Notes (Signed)
Per MD order to follow withdrawal guidelines for end of life. Pt seems comfortable. RT and RN at bedside. Extubated pt to RA with no complications.

## 2014-04-13 NOTE — Progress Notes (Signed)
Spoke with Dr. Vertell Limber following results of CT scan. Instructed to drop drain to 5cm H20. Will monitor.

## 2014-04-13 NOTE — Discharge Summary (Signed)
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  Past Medical History  Diagnosis Date  . 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 Unspecified vitamin D deficiency   . Obstructive sleep apnea (adult) (pediatric)   . Shortness of breath   . Transient ischemic attack (TIA), and cerebral infarction without residual deficits(V12.54)   . Other dyspnea and respiratory abnormality   . Right bundle branch block   . Type II or unspecified type diabetes mellitus without mention of complication, not stated as uncontrolled   . Unspecified essential hypertension   . Hypertrophy of prostate without urinary obstruction and other lower urinary tract symptoms (LUTS)   . Other and unspecified hyperlipidemia   . Reflux esophagitis   . Occlusion and stenosis of carotid artery without mention of cerebral infarction   . Barrett's esophagus   . Diverticulosis of colon (without mention of hemorrhage)   . Benign neoplasm of colon   . Unspecified glaucoma   . Cataract 1979    O.S. Dr. Davis Gourd  . Unspecified cataract 1984    O.D Dr. Ishmael Holter  . Obesity, unspecified   . Reflux esophagitis    Past Surgical History  Procedure Laterality Date  . Rectal surgery  1983 & 1985    Drained Dr. Modena Morrow  . Colonoscopy  04/19/2007    Dr Lajoyce Corners  . Bb injury o.s.   1947  . Vasectomy  1972  . Cataract extraction Left 1979    Dr Davis Gourd  . Cataract extraction Right 1984 &  1998    Dr Ishmael Holter  . Neck lesion biopsy  1988    excise neck lump- Dr Elwyn Reach  . Hernia repair Right 08/1993    Dr.T. Davis  . Flexible sigmoidoscopy  11/1998    internal hemrrhoids, simoid polyp, diverticulosis- Dr Nyoka Cowden  . Esophagogastroduodenoscopy  04/19/2007    Esophageal ulcer, Barett's Esophagus  . Spirometry  02/25/2010    moderate airflow limitation      Medication List    ASK your doctor about these medications       aspirin EC 81 MG tablet  Take 81 mg by mouth daily.     clopidogrel 75 MG tablet  Commonly known as:  PLAVIX  Take 75 mg by mouth daily.     clotrimazole 1 % cream  Commonly known as:  LOTRIMIN  Apply 1 application topically daily as needed (for toes).     DIPROLENE AF 0.05 % cream  Generic drug:  augmented betamethasone dipropionate  Apply 1 application topically daily as needed (for rash).     dorzolamide-timolol 22.3-6.8 MG/ML ophthalmic solution  Commonly known as:  COSOPT  Place 1 drop into both eyes 2 (two) times daily.     finasteride 5 MG tablet  Commonly known as:  PROSCAR  Take 5 mg by mouth daily.     fluocinonide cream 0.05 %  Commonly known as:  LIDEX  Use as  directed 1 application in the mouth or throat 4 (four) times daily as needed (for mouth sores).     lisinopril 40 MG tablet  Commonly known as:  PRINIVIL,ZESTRIL  Take 20 mg by mouth daily.     pantoprazole 40 MG tablet  Commonly known as:  PROTONIX  Take 40 mg by mouth daily.     simvastatin 20 MG tablet  Commonly known as:  ZOCOR  Take 10 mg by mouth at bedtime.     tamsulosin 0.4 MG Caps capsule  Commonly known as:  FLOMAX  Take 0.4 mg by mouth at bedtime. Take 1 capsule by mouth at bedtime for prostate.     vardenafil 20 MG tablet  Commonly known as:  LEVITRA  Take 20 mg by mouth daily as needed for erectile dysfunction.        LABORATORY STUDIES CBC    Component Value Date/Time   WBC 7.7 04/02/2014 0255   RBC 4.12* 04/02/2014 0255   HGB 13.7 04/02/2014  0255   HCT 41.1 04/02/2014 0255   PLT 167 04/02/2014 0255   MCV 99.8 04/02/2014 0255   MCH 33.3 04/02/2014 0255   MCHC 33.3 04/02/2014 0255   RDW 12.9 04/02/2014 0255   LYMPHSABS 1.2 03/30/2014 1937   MONOABS 0.7 04/11/2014 1937   EOSABS 0.1 04/08/2014 1937   BASOSABS 0.0 03/25/2014 1937   CMP    Component Value Date/Time   NA 137 04/02/2014 0255   NA 141 10/17/2013 1119   K 5.1 04/02/2014 0255   CL 98 04/02/2014 0255   CO2 25 04/02/2014 0255   GLUCOSE 122* 04/02/2014 0255   GLUCOSE 97 10/17/2013 1119   BUN 15 04/02/2014 0255   BUN 17 10/17/2013 1119   CREATININE 1.13 04/02/2014 0255   CALCIUM 7.9* 04/02/2014 0255   PROT 7.1 03/27/2014 1937   PROT 6.3 10/17/2013 1119   ALBUMIN 3.7 04/10/2014 1937   AST 52* 04/06/2014 1937   ALT 61* 03/17/2014 1937   ALKPHOS 45 03/14/2014 1937   BILITOT 0.5 03/17/2014 1937   GFRNONAA 61* 04/02/2014 0255   GFRAA 71* 04/02/2014 0255   COAGS Lab Results  Component Value Date   INR 1.03 03/15/2014   INR 1.03 11/09/2009   Lipid Panel    Component Value Date/Time   TRIG 104 10/17/2013 1119   HDL 43 10/17/2013 1119   CHOLHDL 3.4 10/17/2013 1119   LDLCALC 83 10/17/2013 1119   HgbA1C  Lab Results  Component Value Date   HGBA1C 6.1* 05/08/2013   Cardiac Panel (last 3 results)  Recent Labs  03/19/2014 1937  TROPONINI <0.30   Urinalysis    Component Value Date/Time   COLORURINE YELLOW 03/31/2014 2003   APPEARANCEUR CLEAR 03/15/2014 2003   LABSPEC 1.017 03/21/2014 2003   PHURINE 6.5 03/16/2014 2003   GLUCOSEU 250* 03/27/2014 2003   HGBUR LARGE* 04/11/2014 2003   BILIRUBINUR NEGATIVE 03/14/2014 2003   KETONESUR NEGATIVE 03/19/2014 2003   PROTEINUR 30* 03/27/2014 2003   UROBILINOGEN 1.0 04/11/2014 2003   NITRITE NEGATIVE 03/22/2014 2003   LEUKOCYTESUR NEGATIVE 03/31/2014 2003   Urine Drug Screen  No results found for this basename: labopia, cocainscrnur, labbenz, amphetmu, thcu, labbarb    Alcohol Level No results found for this basename: eth     SIGNIFICANT DIAGNOSTIC  STUDIES Ct Head Wo Contrast  04-22-14 CLINICAL DATA: Intraventricular hemorrhage. EXAM: CT HEAD WITHOUT CONTRAST TECHNIQUE: Contiguous axial images were obtained from the base of the skull through the vertex without intravenous contrast. COMPARISON:  CT of the head April 01, 2014 FINDINGS: Diffuse intraventricular hemorrhage, with worsening ventriculomegaly, anterior recess of the third ventricle is 22 mm, previously 14 mm. Increasing amount of layering blood products within the occipital horn. Status post right frontal ventriculostomy placement, with distal tip terminating in the right frontal horn. Low-density presumed vasogenic edema versus retrograde flow cerebral spinal fluid extends along the catheter tract. Worsening periventricular low-density most consistent with transependymal flow cerebral spinal fluid. Again seen is left cerebellar intraparenchymal hemorrhage with intraventricular extension. Upward and downward cerebellar herniation with basal cistern effacement. Small amount of subarachnoid blood products within the right parietal sulci, bilateral mesial occipital sulci. No midline shift. No acute large vascular territory infarct. Severe calcific atherosclerosis of the carotid siphons. Patient is intubated. Mild paranasal sinus mucosal thickening. Status post left scleral banding with dense calcification versus radiopaque foreign body within the medial left orbit. Mastoid air cells are well aerated. No skull fracture. IMPRESSION: Interval placement of high right frontal ventriculostomy catheter with distal tip in right frontal ventricle, increasing intraventricular hemorrhage and worsening hydrocephalus/transependymal flow cerebral spinal fluid. Basal cistern effacement with cerebellar herniation, evolving left cerebellum intraparenchymal hematoma. New small amount of parietal occipital subarachnoid blood. Findings discussed with and reconfirmed by Dr.STERN on08-09-15at1:50 am. Electronically Signed  By: Elon Alas On: 21-Apr-2014 01:53  Ct Head Wo Contrast  03/31/2014 CLINICAL DATA: Altered mental status found down with diaphoresis and weakness. Headache. EXAM: CT HEAD WITHOUT CONTRAST CT CERVICAL SPINE WITHOUT CONTRAST TECHNIQUE: Multidetector CT imaging of the head and cervical spine was performed following the standard protocol without intravenous contrast. Multiplanar CT image reconstructions of the cervical spine were also generated. COMPARISON: Head CT 11/09/2009. FINDINGS: CT HEAD FINDINGS There is an acute hematoma involving the inferior left cerebellar hemisphere and vermis, measuring 3.6 x 3.4 cm on image 9. There is associated intraventricular hemorrhage involving the fourth, third and lateral ventricles. In addition, there is subarachnoid hemorrhage inferior to the cerebellum, surrounding the cervical spinal cord. A small amount of blood tracks along the inferior surface of the tentorium. Mild hydrocephalus is present. There is low density throughout the brainstem worrisome for edema. No focal infarct identified. There are no acute supratentorial abnormalities or midline shift. Patient is intubated. There is ethmoid sinus mucosal thickening. The mastoid air cells are clear. The calvarium is intact. CT CERVICAL SPINE FINDINGS The cervical alignment is normal. There is no evidence of acute fracture or traumatic subluxation. There is multilevel spondylosis with disc space loss, uncinate spurring and facet hypertrophy, most advanced from C4-5 through C6-7. There is inferior extension of subarachnoid hemorrhage into the cervical spinal canal. No cord compression evident tracheostomy, nasogastric tube and diffuse vascular calcifications noted. IMPRESSION: 1. Acute hemorrhage within the inferior left cerebellar hemisphere and vermis, likely hypertensive. There is associated intraventricular hemorrhage, subarachnoid hemorrhage and hydrocephalus. 2. Probable diffuse brainstem edema, implying a poor  prognosis. 3. Subarachnoid blood extends into the cervical spinal canal. 4. No evidence of acute cervical spine fracture, traumatic subluxation or static signs of instability. Diffuse spondylosis. 5. Critical Value/emergent results were called by telephone at the time of interpretation on 03/20/2014 at 8:37 pm to Dr. Elnora Morrison , who verbally acknowledged these results. Electronically Signed By: Camie Patience M.D. On: 04/08/2014 20:43  Ct Cervical Spine Wo Contrast  03/19/2014 CLINICAL DATA: Altered mental status found down with diaphoresis and weakness. Headache. EXAM: CT HEAD WITHOUT CONTRAST CT CERVICAL SPINE WITHOUT CONTRAST TECHNIQUE: Multidetector CT imaging of the head and cervical spine was performed following the  standard protocol without intravenous contrast. Multiplanar CT image reconstructions of the cervical spine were also generated. COMPARISON: Head CT 11/09/2009. FINDINGS: CT HEAD FINDINGS There is an acute hematoma involving the inferior left cerebellar hemisphere and vermis, measuring 3.6 x 3.4 cm on image 9. There is associated intraventricular hemorrhage involving the fourth, third and lateral ventricles. In addition, there is subarachnoid hemorrhage inferior to the cerebellum, surrounding the cervical spinal cord. A small amount of blood tracks along the inferior surface of the tentorium. Mild hydrocephalus is present. There is low density throughout the brainstem worrisome for edema. No focal infarct identified. There are no acute supratentorial abnormalities or midline shift. Patient is intubated. There is ethmoid sinus mucosal thickening. The mastoid air cells are clear. The calvarium is intact. CT CERVICAL SPINE FINDINGS The cervical alignment is normal. There is no evidence of acute fracture or traumatic subluxation. There is multilevel spondylosis with disc space loss, uncinate spurring and facet hypertrophy, most advanced from C4-5 through C6-7. There is inferior extension of  subarachnoid hemorrhage into the cervical spinal canal. No cord compression evident tracheostomy, nasogastric tube and diffuse vascular calcifications noted. IMPRESSION: 1. Acute hemorrhage within the inferior left cerebellar hemisphere and vermis, likely hypertensive. There is associated intraventricular hemorrhage, subarachnoid hemorrhage and hydrocephalus. 2. Probable diffuse brainstem edema, implying a poor prognosis. 3. Subarachnoid blood extends into the cervical spinal canal. 4. No evidence of acute cervical spine fracture, traumatic subluxation or static signs of instability. Diffuse spondylosis. 5. Critical Value/emergent results were called by telephone at the time of interpretation on 03/24/2014 at 8:37 pm to Dr. Elnora Morrison , who verbally acknowledged these results. Electronically Signed By: Camie Patience M.D. On: 04/02/2014 20:43  Dg Chest Port 1 View  04/02/2014 CLINICAL DATA: Re-evaluate airspace disease. EXAM: PORTABLE CHEST - 1 VIEW COMPARISON: Portable chest x-ray of April 01, 2014 FINDINGS: The lungs are less well inflated on the current study. The right lung is clear. On the left the hemidiaphragm remains obscured. The retrocardiac region remains dense. The cardiac silhouette is mildly enlarged. The pulmonary vascularity is normal. The endotracheal tube tip lies approximately 3.4 cm above the crotch of the carina. The esophagogastric tube tip projects off the film. IMPRESSION: Persistent abnormality at the left lung base consistent with atelectasis and/or pneumonia. A small amount of pleural fluid may be present. Electronically Signed By: David Martinique On: 04/02/2014 07:43  Dg Chest Portable 1 View  04/11/2014 CLINICAL DATA: Altered mental status. Unresponsive. Intubation. EXAM: PORTABLE CHEST - 1 VIEW COMPARISON: 11/09/2009 FINDINGS: Endotracheal tube is 4 cm above the carina. NG tube enters the stomach. Patchy airspace disease within the left lung, new since prior study. Cannot exclude infection  or aspiration. No confluent opacity on the right. Small left pleural effusion. Mild cardiomegaly. IMPRESSION: Patchy left lung airspace disease with small left effusion. Cannot exclude infection or aspiration. Endotracheal tube 4 cm above the carina. Electronically Signed By: Rolm Baptise M.D. On: 04/09/2014 20:21    History of Present Illness   78 year old male with history of multiple medical problems including hypertension, DM and OSA presented with progressive headaches and lost consciousness. He was intubated in the field. Transferred to [facility] emergency department where he continued to decline neurologically. Upon arrival emergency department patient would not open his eyes to noxious stimuli. He exhibited some minimal extensor posturing. No movements to command. Head CT scan demonstrated evidence of a large posterior fossa hemorrhage beginning in his left inferior cerebellar hemisphere and rupturing into his fourth ventricle. There is a  large amount of intraventricular blood with secondary obstructive hydrocephalus. The patient is on aspirin at home. He has no other known anticoagulant use. He was admitted to the neuro ICU for further evaluation and treatment.  Hospital Course  After admission, NSG was consulte and EVD done to relieve hydrocephalus and brain herniation. However, pt condition did not improve. He remained to be intubated with minimal brainstem function. Repeat CT head showed worsening ventricular bleeding during the interval time and continued hydrocephalus. Discussed with family regarding the diagnosis, treatment options as well as grave prognosis. Pt family requested withdraw care. On 7/22, withdraw care initiated and pt passed away at 15:15pm.   Discharge Exam  Passed away  Signed  Rosalin Hawking, MD PhD 04-06-14 6:46 PM

## 2014-04-13 NOTE — Progress Notes (Signed)
Decreased vent settings to follow withdrawal guidelines. Pt seems comfortable

## 2014-04-13 DEATH — deceased

## 2014-05-31 ENCOUNTER — Other Ambulatory Visit: Payer: Medicare Other

## 2014-06-04 ENCOUNTER — Encounter: Payer: Medicare Other | Admitting: Internal Medicine

## 2015-06-09 IMAGING — CR DG CHEST 1V PORT
2 series · 2 of 2 positions shown · non-contrast
Comparison: Portable chest x-ray April 01, 2014

CLINICAL DATA: Re-evaluate airspace disease.

EXAM:
PORTABLE CHEST - 1 VIEW

[AP (1 of 2)]
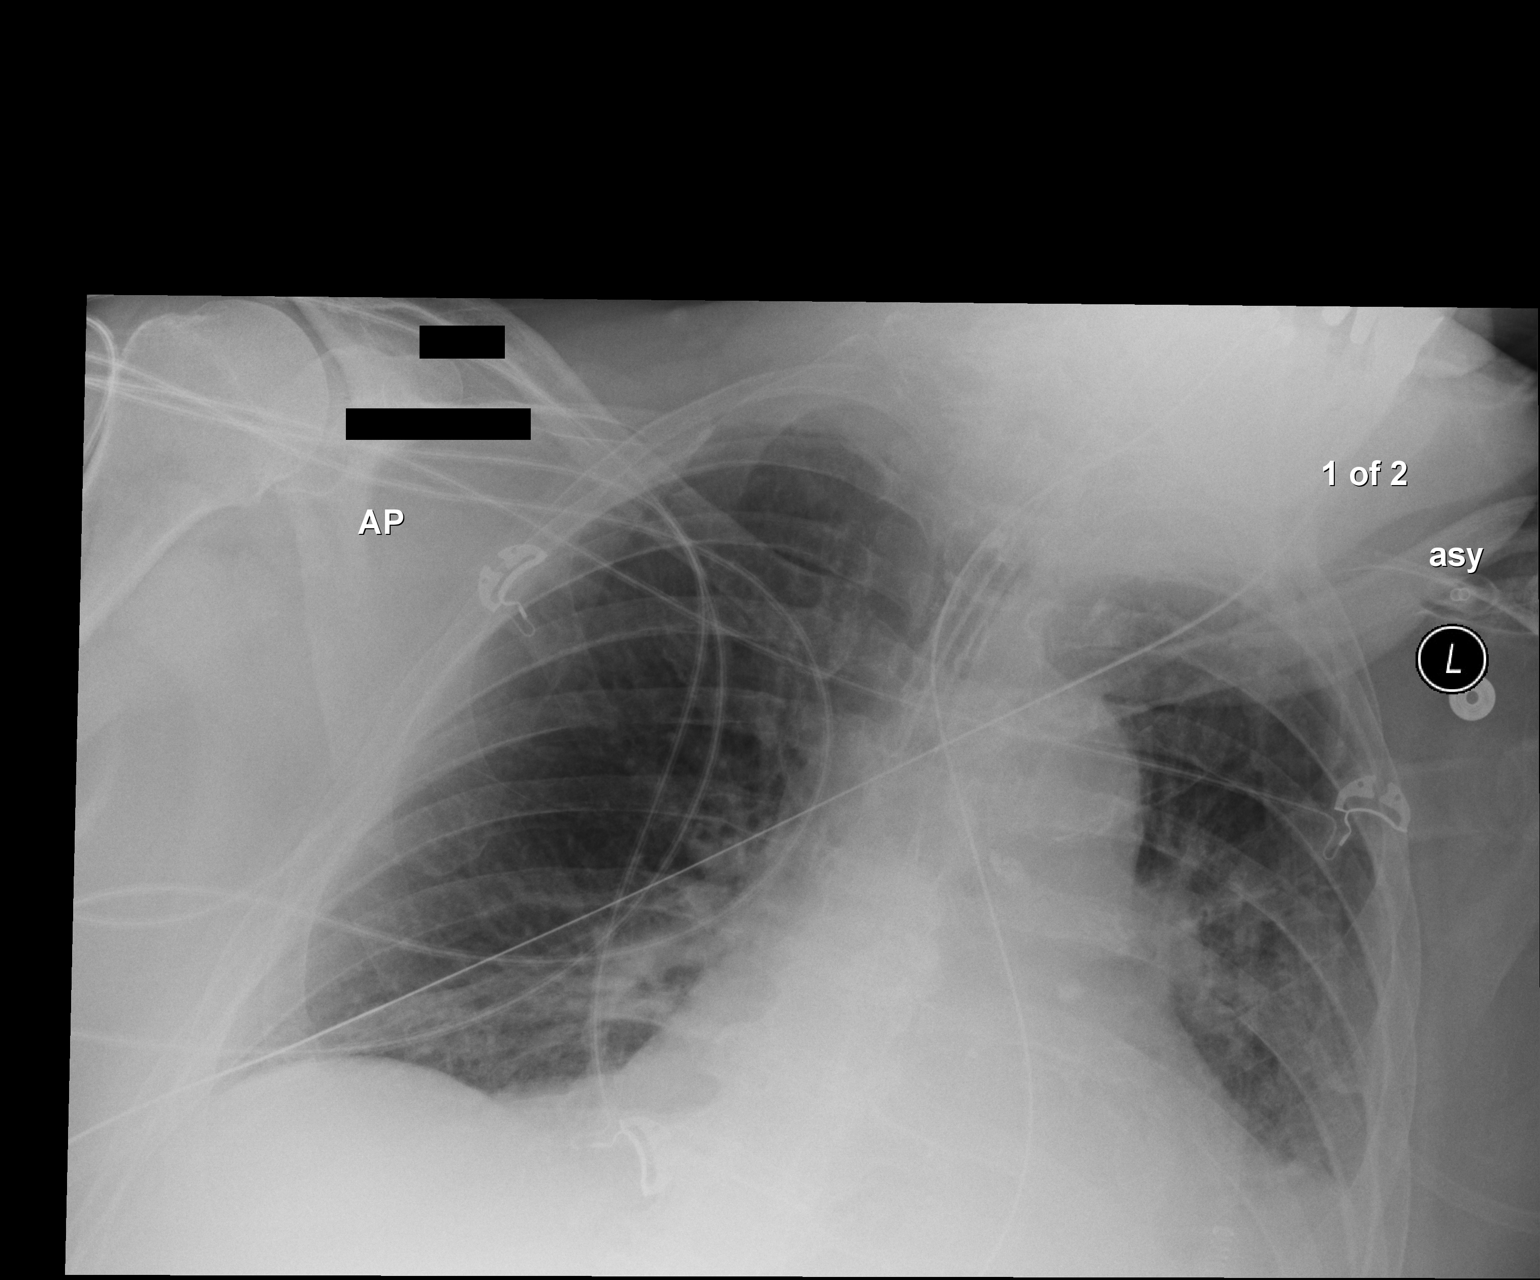

[AP (2 of 2)]
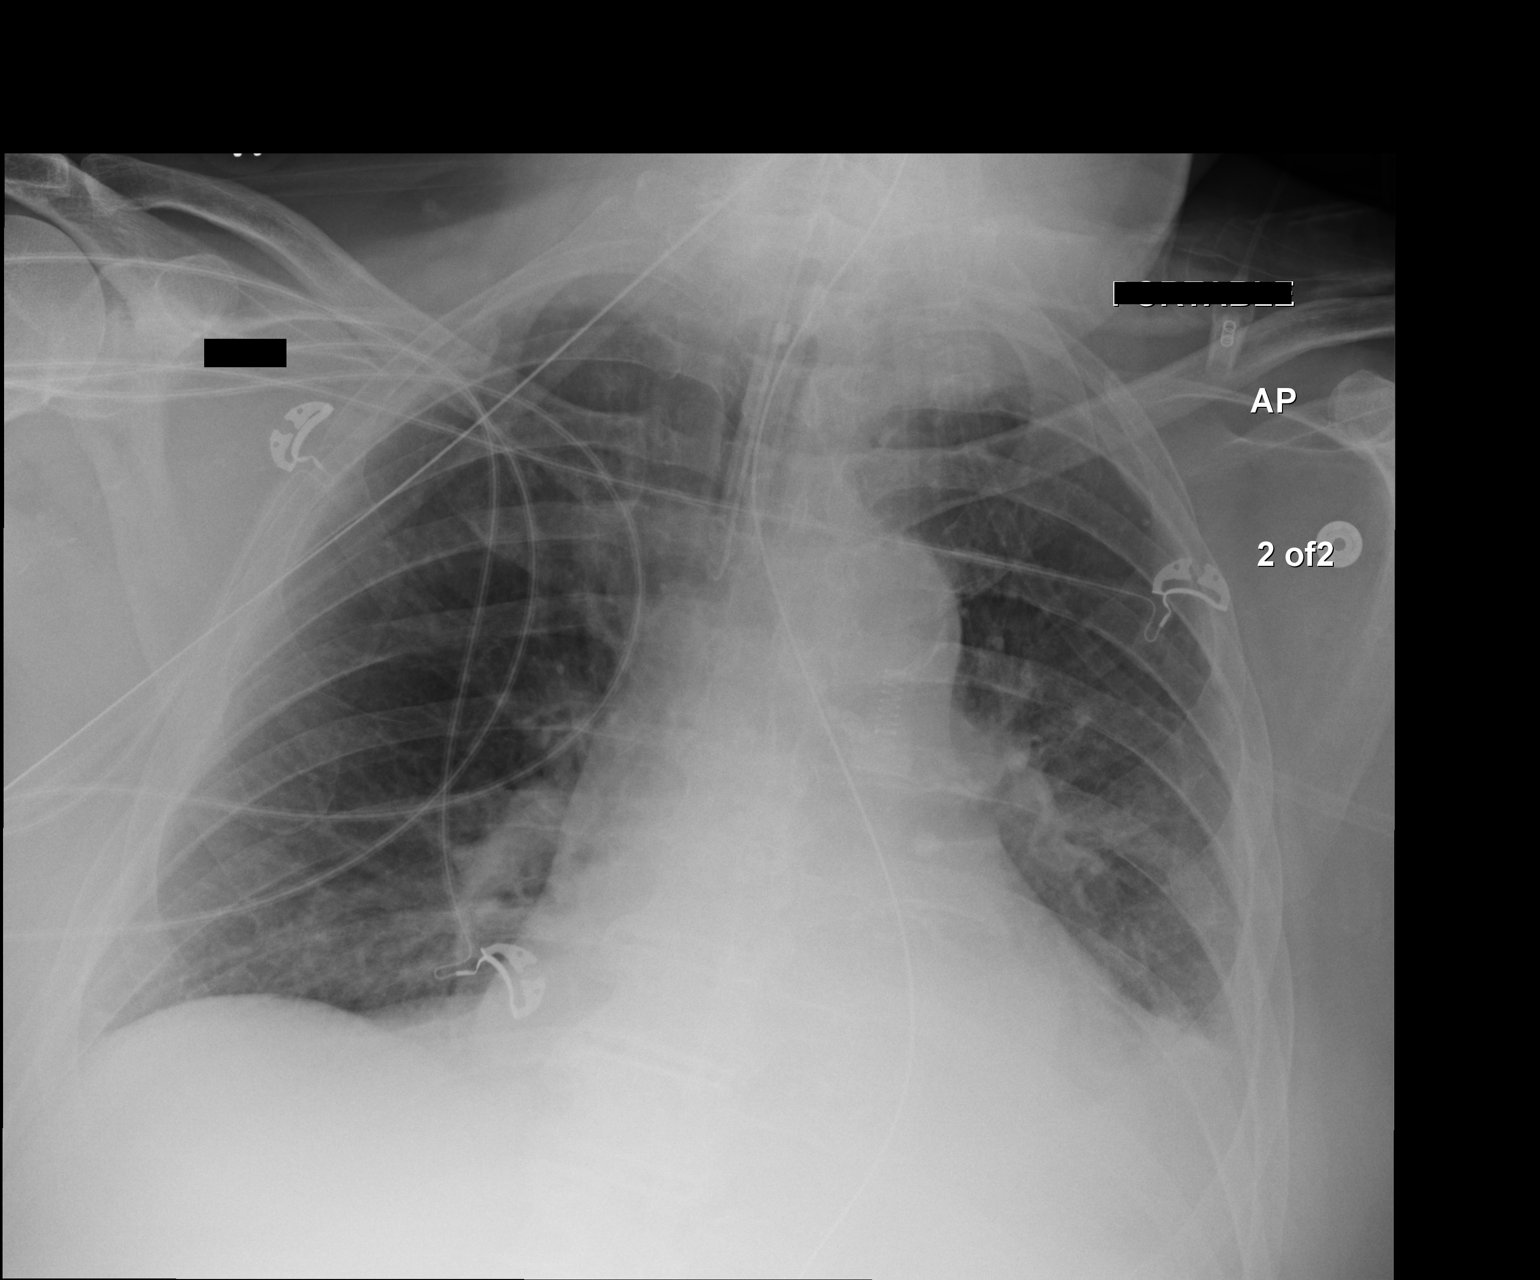

[2 of 2 positions shown; findings below may reference images not displayed]

FINDINGS: The lungs are less well inflated on the current study. The right
lung is clear. On the left the hemidiaphragm remains obscured. The
retrocardiac region remains dense. The cardiac silhouette is mildly
enlarged. The pulmonary vascularity is normal.

The endotracheal tube tip lies approximately 3.4 cm above the crotch
of the carina. The esophagogastric tube tip projects off the film.
IMPRESSION: Persistent abnormality at the left lung base consistent with
atelectasis and/or pneumonia. A small amount of pleural fluid may be
present.

## 2015-06-10 IMAGING — CT CT HEAD W/O CM
1 series · 15 of 30 positions shown, 19 images · non-contrast
Comparison: CT of the head April 01, 2014

CLINICAL DATA: Intraventricular hemorrhage.

EXAM:
CT HEAD WITHOUT CONTRAST
TECHNIQUE: Contiguous axial images were obtained from the base of the skull
through the vertex without intravenous contrast.

[Series 2: head 5.0 h30s · axial · 0.51mm/px · z∈[-92,+63]mm · 15 of 35 slices shown, 19 images]
[im 2/35  brain]
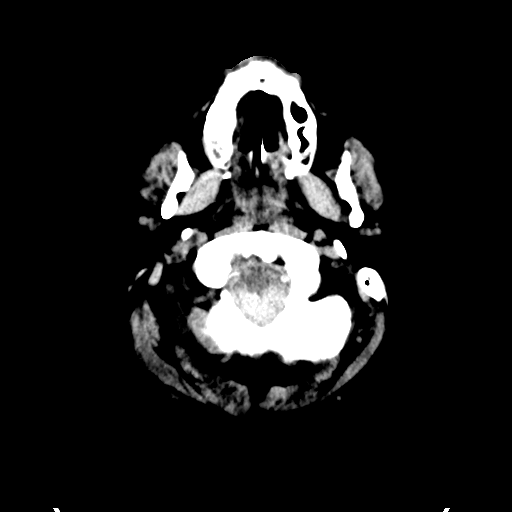
[im 2/35  bone]
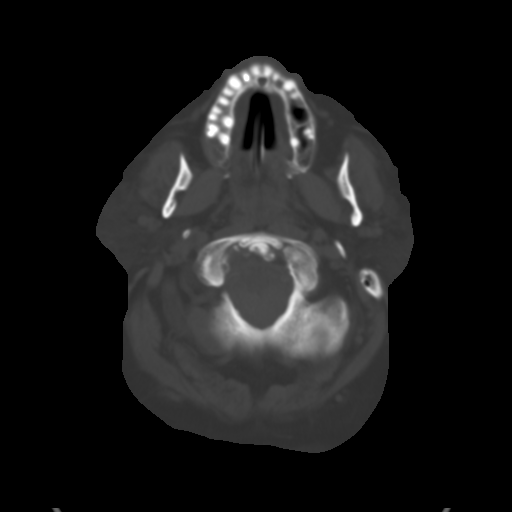
[im 4/35  brain]
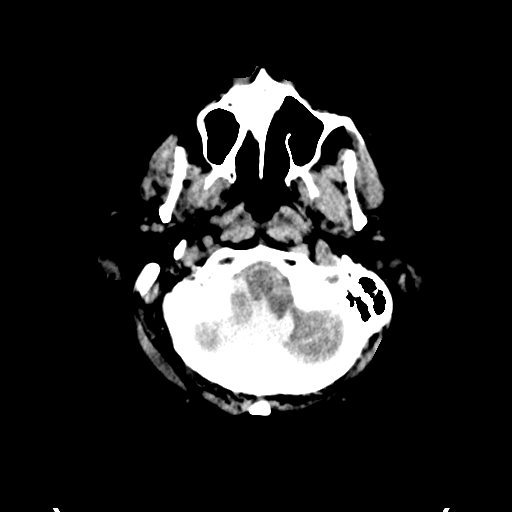
[im 6/35  brain]
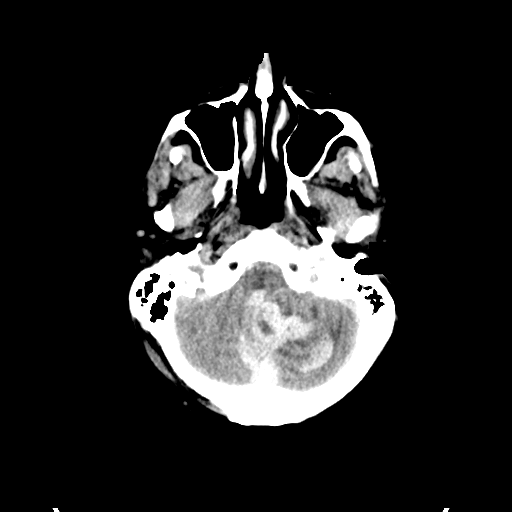
[im 9/35  brain]
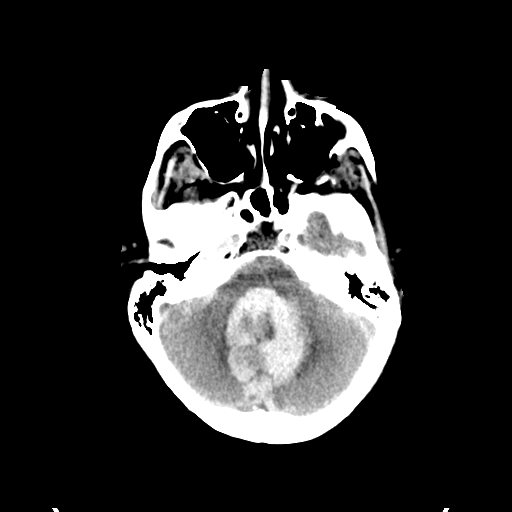
[im 11/35  brain]
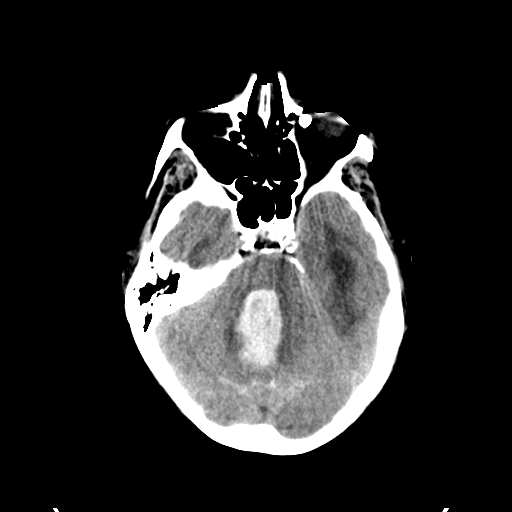
[im 11/35  bone]
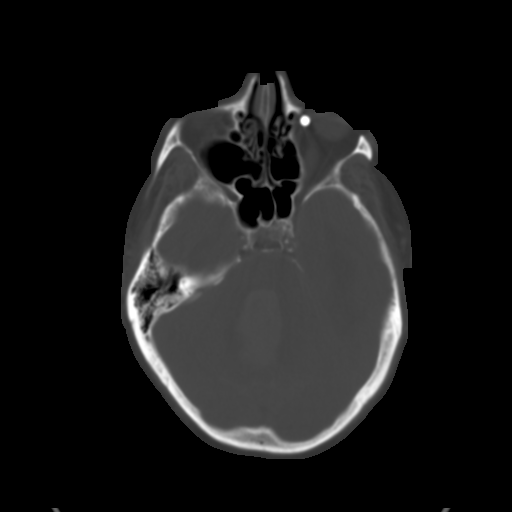
[im 13/35  brain]
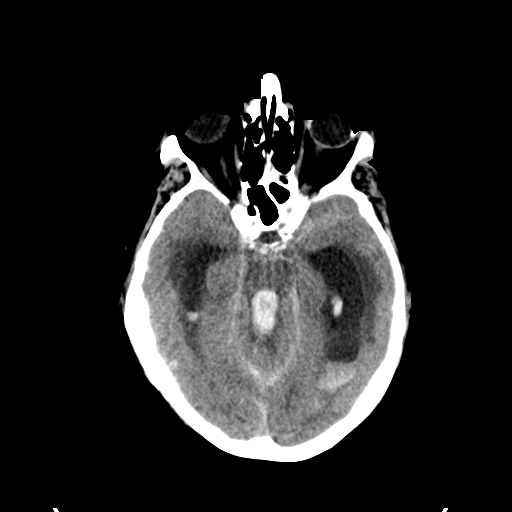
[im 16/35  brain]
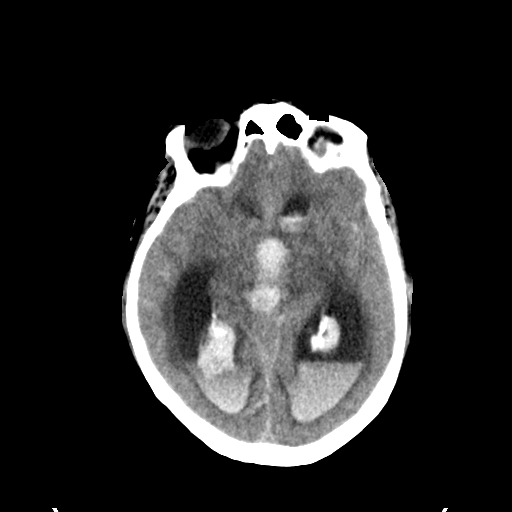
[im 18/35  brain]
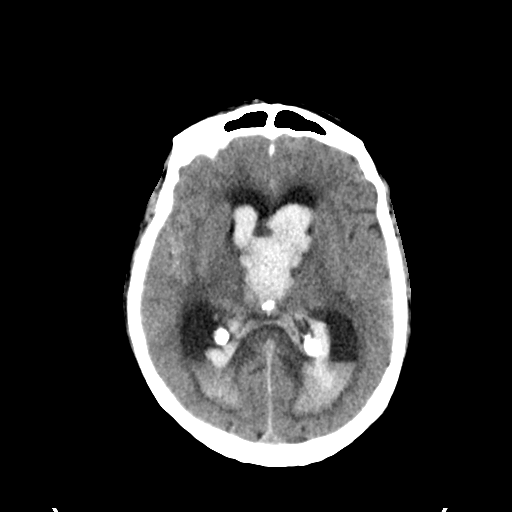
[im 19/35  brain]
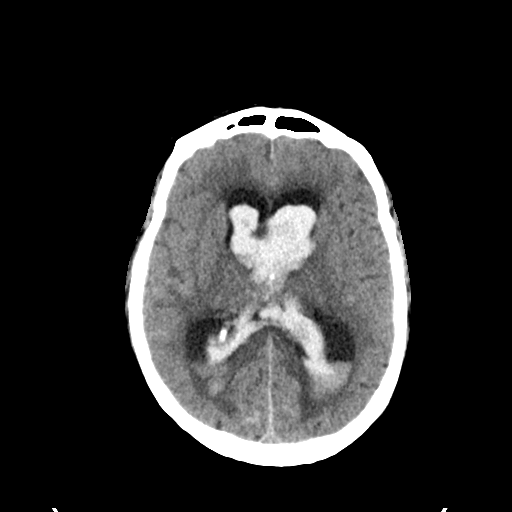
[im 19/35  bone]
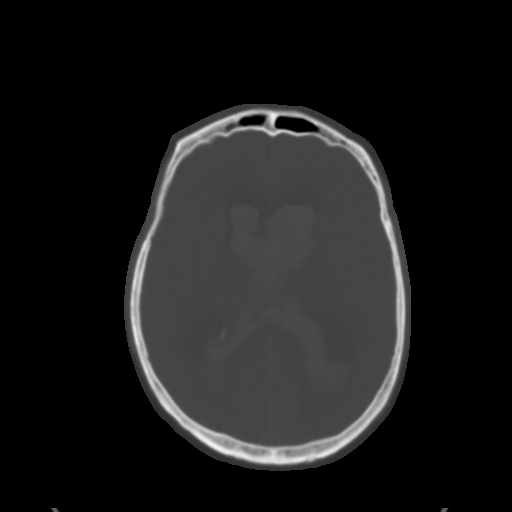
[im 22/35  brain]
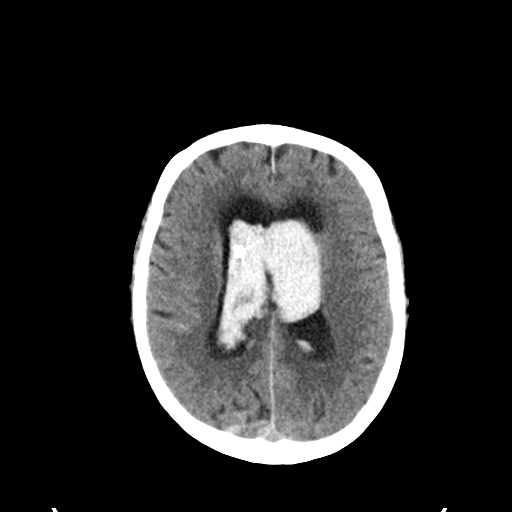
[im 24/35  brain]
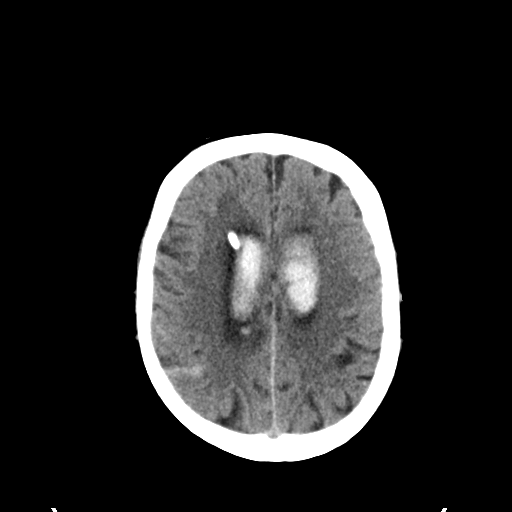
[im 26/35  brain]
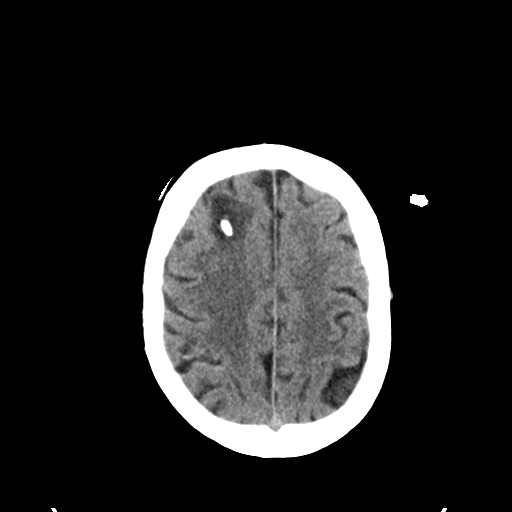
[im 29/35  brain]
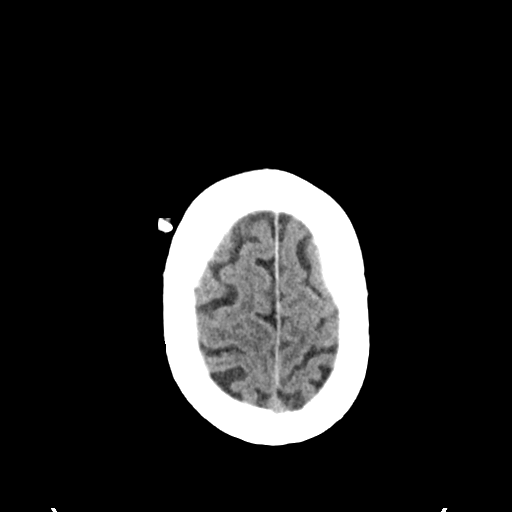
[im 29/35  bone]
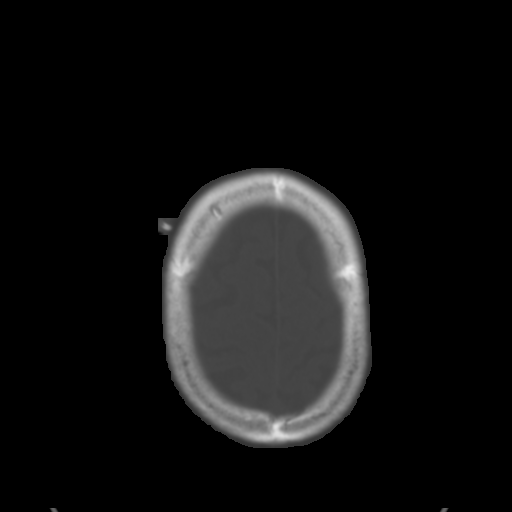
[im 31/35  brain]
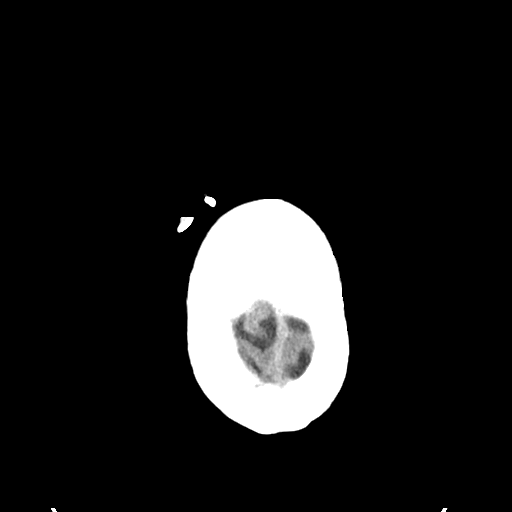
[im 33/35  brain]
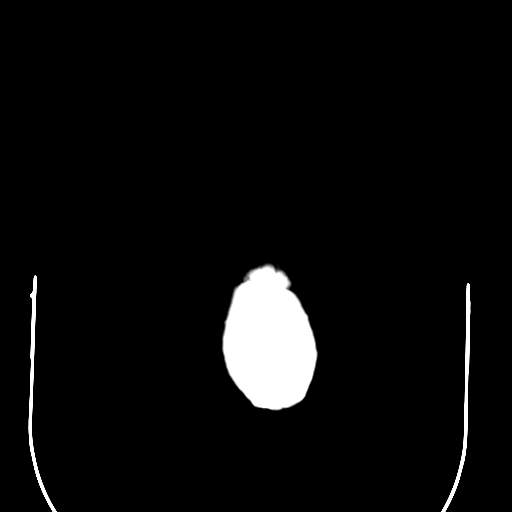

[15 of 30 positions shown; findings below may reference images not displayed]

FINDINGS: Diffuse intraventricular hemorrhage, with worsening
ventriculomegaly, anterior recess of the third ventricle is 22 mm,
previously 14 mm. Increasing amount of layering blood products
within the occipital horn. Status post right frontal ventriculostomy
placement, with distal tip terminating in the right frontal horn.
Low-density presumed vasogenic edema versus retrograde flow cerebral
spinal fluid extends along the catheter tract. Worsening
periventricular low-density most consistent with transependymal flow
cerebral spinal fluid. Again seen is left cerebellar
intraparenchymal hemorrhage with intraventricular extension. Upward
and downward cerebellar herniation with basal cistern effacement.

Small amount of subarachnoid blood products within the right
parietal sulci, bilateral mesial occipital sulci. No midline shift.
No acute large vascular territory infarct.

Severe calcific atherosclerosis of the carotid siphons. Patient is
intubated. Mild paranasal sinus mucosal thickening. Status post left
scleral banding with dense calcification versus radiopaque foreign
body within the medial left orbit. Mastoid air cells are well
aerated. No skull fracture.
IMPRESSION: Interval placement of high right frontal ventriculostomy catheter
with distal tip in right frontal ventricle, increasing
intraventricular hemorrhage and worsening
hydrocephalus/transependymal flow cerebral spinal fluid. Basal
cistern effacement with cerebellar herniation, evolving left
cerebellum intraparenchymal hematoma.

New small amount of parietal occipital subarachnoid blood.

Findings discussed with and reconfirmed by Dr.ROMWELL
on04/03/2014at[DATE].

  By: Trond Jarle Duvold
